# Patient Record
Sex: Female | Born: 1935 | Race: White | Hispanic: Yes | Marital: Married | Smoking: Never smoker
Health system: Southern US, Community
[De-identification: ages and names within clinical notes are randomized; demographics above are authoritative.]

## PROBLEM LIST (undated history)

## (undated) DIAGNOSIS — M810 Age-related osteoporosis without current pathological fracture: Secondary | ICD-10-CM

## (undated) DIAGNOSIS — K449 Diaphragmatic hernia without obstruction or gangrene: Secondary | ICD-10-CM

## (undated) DIAGNOSIS — I10 Essential (primary) hypertension: Secondary | ICD-10-CM

## (undated) DIAGNOSIS — K635 Polyp of colon: Secondary | ICD-10-CM

## (undated) HISTORY — DX: Age-related osteoporosis without current pathological fracture: M81.0

## (undated) HISTORY — DX: Polyp of colon: K63.5

## (undated) HISTORY — DX: Diaphragmatic hernia without obstruction or gangrene: K44.9

## (undated) HISTORY — PX: ABDOMINAL HYSTERECTOMY: SHX81

## (undated) HISTORY — DX: Essential (primary) hypertension: I10

---

## 2006-06-23 ENCOUNTER — Ambulatory Visit: Payer: Self-pay | Admitting: Pain Medicine

## 2006-07-27 ENCOUNTER — Ambulatory Visit: Payer: Self-pay | Admitting: Anesthesiology

## 2006-07-28 ENCOUNTER — Ambulatory Visit: Payer: Self-pay | Admitting: Pain Medicine

## 2008-04-05 ENCOUNTER — Ambulatory Visit: Payer: Self-pay | Admitting: Pain Medicine

## 2011-11-03 ENCOUNTER — Ambulatory Visit: Payer: Self-pay | Admitting: Pain Medicine

## 2011-11-03 LAB — HEPATIC FUNCTION PANEL A (ARMC)
Bilirubin, Direct: 0.2 mg/dL (ref 0.00–0.20)
SGOT(AST): 34 U/L (ref 15–37)
SGPT (ALT): 57 U/L
Total Protein: 7.6 g/dL (ref 6.4–8.2)

## 2011-11-29 ENCOUNTER — Ambulatory Visit: Payer: Self-pay | Admitting: Pain Medicine

## 2012-06-29 ENCOUNTER — Ambulatory Visit: Payer: Self-pay | Admitting: Pain Medicine

## 2012-10-25 HISTORY — PX: ABDOMINAL HYSTERECTOMY: SHX81

## 2012-12-24 DIAGNOSIS — N952 Postmenopausal atrophic vaginitis: Secondary | ICD-10-CM | POA: Insufficient documentation

## 2012-12-24 DIAGNOSIS — R339 Retention of urine, unspecified: Secondary | ICD-10-CM | POA: Insufficient documentation

## 2012-12-24 DIAGNOSIS — N814 Uterovaginal prolapse, unspecified: Secondary | ICD-10-CM | POA: Insufficient documentation

## 2013-01-15 DIAGNOSIS — R3129 Other microscopic hematuria: Secondary | ICD-10-CM | POA: Insufficient documentation

## 2013-01-15 DIAGNOSIS — I10 Essential (primary) hypertension: Secondary | ICD-10-CM | POA: Insufficient documentation

## 2014-04-01 ENCOUNTER — Ambulatory Visit: Payer: Self-pay | Admitting: Pain Medicine

## 2014-04-08 ENCOUNTER — Other Ambulatory Visit: Payer: Self-pay | Admitting: Family Medicine

## 2014-04-08 DIAGNOSIS — M810 Age-related osteoporosis without current pathological fracture: Secondary | ICD-10-CM

## 2014-04-18 ENCOUNTER — Ambulatory Visit
Admission: RE | Admit: 2014-04-18 | Discharge: 2014-04-18 | Disposition: A | Discharge status: Home / Self Care | Payer: Medicare HMO | Source: Ambulatory Visit | Attending: Family Medicine | Admitting: Family Medicine

## 2014-04-18 DIAGNOSIS — M810 Age-related osteoporosis without current pathological fracture: Secondary | ICD-10-CM

## 2014-04-29 ENCOUNTER — Other Ambulatory Visit: Payer: Self-pay

## 2016-10-05 ENCOUNTER — Telehealth: Payer: Self-pay | Admitting: Pain Medicine

## 2016-10-05 ENCOUNTER — Encounter: Payer: Self-pay | Admitting: Pain Medicine

## 2016-10-05 ENCOUNTER — Other Ambulatory Visit: Payer: Self-pay | Admitting: Pain Medicine

## 2016-10-05 DIAGNOSIS — M159 Polyosteoarthritis, unspecified: Secondary | ICD-10-CM | POA: Insufficient documentation

## 2016-10-05 DIAGNOSIS — M7918 Myalgia, other site: Secondary | ICD-10-CM

## 2016-10-05 DIAGNOSIS — M810 Age-related osteoporosis without current pathological fracture: Secondary | ICD-10-CM

## 2016-10-05 DIAGNOSIS — G8929 Other chronic pain: Secondary | ICD-10-CM | POA: Insufficient documentation

## 2016-10-05 DIAGNOSIS — M5136 Other intervertebral disc degeneration, lumbar region: Secondary | ICD-10-CM

## 2016-10-05 DIAGNOSIS — Z87898 Personal history of other specified conditions: Secondary | ICD-10-CM

## 2016-10-05 DIAGNOSIS — M79605 Pain in left leg: Secondary | ICD-10-CM

## 2016-10-05 DIAGNOSIS — N952 Postmenopausal atrophic vaginitis: Secondary | ICD-10-CM

## 2016-10-05 DIAGNOSIS — M51369 Other intervertebral disc degeneration, lumbar region without mention of lumbar back pain or lower extremity pain: Secondary | ICD-10-CM

## 2016-10-05 DIAGNOSIS — M542 Cervicalgia: Secondary | ICD-10-CM

## 2016-10-05 DIAGNOSIS — R3129 Other microscopic hematuria: Secondary | ICD-10-CM

## 2016-10-05 DIAGNOSIS — R339 Retention of urine, unspecified: Secondary | ICD-10-CM

## 2016-10-05 DIAGNOSIS — M25561 Pain in right knee: Secondary | ICD-10-CM

## 2016-10-05 DIAGNOSIS — M4310 Spondylolisthesis, site unspecified: Secondary | ICD-10-CM | POA: Insufficient documentation

## 2016-10-05 DIAGNOSIS — M5442 Lumbago with sciatica, left side: Secondary | ICD-10-CM

## 2016-10-05 DIAGNOSIS — M47816 Spondylosis without myelopathy or radiculopathy, lumbar region: Secondary | ICD-10-CM | POA: Insufficient documentation

## 2016-10-05 DIAGNOSIS — M150 Primary generalized (osteo)arthritis: Secondary | ICD-10-CM

## 2016-10-05 DIAGNOSIS — E559 Vitamin D deficiency, unspecified: Secondary | ICD-10-CM

## 2016-10-05 DIAGNOSIS — M25562 Pain in left knee: Secondary | ICD-10-CM

## 2016-10-05 DIAGNOSIS — I10 Essential (primary) hypertension: Secondary | ICD-10-CM

## 2016-10-05 DIAGNOSIS — N814 Uterovaginal prolapse, unspecified: Secondary | ICD-10-CM

## 2016-10-05 DIAGNOSIS — E7800 Pure hypercholesterolemia, unspecified: Secondary | ICD-10-CM

## 2016-10-05 DIAGNOSIS — R12 Heartburn: Secondary | ICD-10-CM | POA: Insufficient documentation

## 2016-10-05 MED ORDER — CHOLECALCIFEROL 25 MCG (1000 UT) PO TABS: 1000.0000 [IU] | ORAL_TABLET | Freq: Every day | ORAL | 0 refills | Status: DC

## 2016-10-05 MED ORDER — ALENDRONATE SODIUM 70 MG PO TABS: 70.0000 mg | ORAL_TABLET | ORAL | 0 refills | Status: DC

## 2016-10-05 MED ORDER — PREDNISONE 20 MG PO TABS: ORAL_TABLET | ORAL | 0 refills | Status: DC

## 2016-10-05 MED ORDER — VITAMIN C 500 MG PO TABS: 500.0000 mg | ORAL_TABLET | Freq: Every day | ORAL | 0 refills | Status: AC

## 2016-10-05 MED ORDER — FAMOTIDINE-CA CARB-MAG HYDROX 10-800-165 MG PO CHEW: 1.0000 | CHEWABLE_TABLET | Freq: Every day | ORAL | 0 refills | Status: DC | PRN

## 2016-10-05 MED ORDER — GLUCOSAMINE-CHONDROITIN 500-400 MG PO TABS: 1.0000 | ORAL_TABLET | Freq: Every day | ORAL | 0 refills | Status: DC

## 2016-10-05 MED ORDER — MELOXICAM 15 MG PO TABS: 15.0000 mg | ORAL_TABLET | Freq: Every day | ORAL | 0 refills | Status: DC

## 2016-10-05 MED ORDER — CYANOCOBALAMIN 1500 MCG PO TBDP: 1.0000 | ORAL_TABLET | Freq: Every day | ORAL | 0 refills | Status: DC

## 2016-10-05 MED ORDER — SIMVASTATIN 20 MG PO TABS: 20.0000 mg | ORAL_TABLET | Freq: Every day | ORAL | 0 refills | Status: DC

## 2016-10-05 MED ORDER — TIZANIDINE HCL 2 MG PO CAPS: 2.0000 mg | ORAL_CAPSULE | Freq: Every day | ORAL | 0 refills | Status: DC

## 2016-10-05 MED ORDER — ASPIRIN 81 MG PO CHEW: 81.0000 mg | CHEWABLE_TABLET | Freq: Every day | ORAL | 0 refills | Status: DC

## 2016-10-05 MED ORDER — MULTIVITAMINS PO CAPS: 1.0000 | ORAL_CAPSULE | Freq: Every day | ORAL | 0 refills | Status: AC

## 2016-10-05 MED ORDER — LOSARTAN POTASSIUM-HCTZ 50-12.5 MG PO TABS: 1.0000 | ORAL_TABLET | Freq: Every day | ORAL | 0 refills | Status: DC
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
## 2016-10-05 NOTE — Telephone Encounter (Signed)
Patient called to indicate she is having a flareup of her low back pain and she wants to come in for possible repeat lumbar epidural steroid injection. I have agreed to bring her in for an evaluation.

## 2016-10-15 ENCOUNTER — Telehealth: Payer: Self-pay | Admitting: Pain Medicine

## 2016-10-15 NOTE — Telephone Encounter (Signed)
Called Pharm. Needed to clarify quantity of tablets of Prednisone. Instructed to fill as order since MD not available until 10/26/16

## 2016-10-15 NOTE — Telephone Encounter (Signed)
[facility] called asking for clarification on script written from Dr. Laban EmperorNaveira  [phone]

## 2016-11-07 ENCOUNTER — Other Ambulatory Visit: Payer: Self-pay | Admitting: Pain Medicine

## 2016-11-07 DIAGNOSIS — M810 Age-related osteoporosis without current pathological fracture: Secondary | ICD-10-CM

## 2016-11-07 MED ORDER — ALENDRONATE SODIUM 70 MG PO TABS: 70.0000 mg | ORAL_TABLET | ORAL | 0 refills | Status: DC

## 2016-11-18 ENCOUNTER — Ambulatory Visit
Admission: RE | Admit: 2016-11-18 | Discharge: 2016-11-18 | Disposition: A | Discharge status: Home / Self Care | Payer: Medicare PPO | Source: Ambulatory Visit | Attending: Pain Medicine | Admitting: Pain Medicine

## 2016-11-18 ENCOUNTER — Other Ambulatory Visit
Admission: RE | Admit: 2016-11-18 | Discharge: 2016-11-18 | Disposition: A | Discharge status: Home / Self Care | Payer: Medicare PPO | Source: Ambulatory Visit | Attending: Pain Medicine | Admitting: Pain Medicine

## 2016-11-18 DIAGNOSIS — M150 Primary generalized (osteo)arthritis: Secondary | ICD-10-CM | POA: Insufficient documentation

## 2016-11-18 DIAGNOSIS — M25562 Pain in left knee: Secondary | ICD-10-CM

## 2016-11-18 DIAGNOSIS — M5136 Other intervertebral disc degeneration, lumbar region: Secondary | ICD-10-CM | POA: Insufficient documentation

## 2016-11-18 DIAGNOSIS — M79605 Pain in left leg: Secondary | ICD-10-CM | POA: Insufficient documentation

## 2016-11-18 DIAGNOSIS — M47816 Spondylosis without myelopathy or radiculopathy, lumbar region: Secondary | ICD-10-CM | POA: Insufficient documentation

## 2016-11-18 DIAGNOSIS — M7918 Myalgia, other site: Principal | ICD-10-CM

## 2016-11-18 DIAGNOSIS — G8929 Other chronic pain: Secondary | ICD-10-CM

## 2016-11-18 DIAGNOSIS — M542 Cervicalgia: Principal | ICD-10-CM

## 2016-11-18 DIAGNOSIS — M25561 Pain in right knee: Secondary | ICD-10-CM | POA: Insufficient documentation

## 2016-11-18 DIAGNOSIS — M159 Polyosteoarthritis, unspecified: Secondary | ICD-10-CM

## 2016-11-18 DIAGNOSIS — M810 Age-related osteoporosis without current pathological fracture: Secondary | ICD-10-CM

## 2016-11-18 DIAGNOSIS — I10 Essential (primary) hypertension: Secondary | ICD-10-CM

## 2016-11-18 DIAGNOSIS — M5442 Lumbago with sciatica, left side: Secondary | ICD-10-CM

## 2016-11-18 DIAGNOSIS — R12 Heartburn: Secondary | ICD-10-CM

## 2016-11-18 DIAGNOSIS — E559 Vitamin D deficiency, unspecified: Secondary | ICD-10-CM

## 2016-11-18 LAB — C-REACTIVE PROTEIN

## 2016-11-18 LAB — COMPREHENSIVE METABOLIC PANEL
ALT: 19 U/L (ref 14–54)
AST: 25 U/L (ref 15–41)
Albumin: 4.4 g/dL (ref 3.5–5.0)
Alkaline Phosphatase: 50 U/L (ref 38–126)
Anion gap: 9 (ref 5–15)
BUN: 22 mg/dL — AB (ref 6–20)
CHLORIDE: 101 mmol/L (ref 101–111)
CO2: 27 mmol/L (ref 22–32)
CREATININE: 0.66 mg/dL (ref 0.44–1.00)
Calcium: 9.5 mg/dL (ref 8.9–10.3)
Glucose, Bld: 95 mg/dL (ref 65–99)
Potassium: 3.4 mmol/L — ABNORMAL LOW (ref 3.5–5.1)
Sodium: 137 mmol/L (ref 135–145)
TOTAL PROTEIN: 7.5 g/dL (ref 6.5–8.1)
Total Bilirubin: 1.3 mg/dL — ABNORMAL HIGH (ref 0.3–1.2)

## 2016-11-18 LAB — VITAMIN B12: Vitamin B-12: 1010 pg/mL — ABNORMAL HIGH (ref 180–914)

## 2016-11-18 LAB — SEDIMENTATION RATE: Sed Rate: 39 mm/hr — ABNORMAL HIGH (ref 0–30)

## 2016-11-18 LAB — MAGNESIUM: MAGNESIUM: 2 mg/dL (ref 1.7–2.4)

## 2016-11-21 LAB — 25-HYDROXYVITAMIN D LCMS D2+D3: 25-HYDROXY, VITAMIN D: 23 ng/mL — AB

## 2016-11-21 LAB — 25-HYDROXY VITAMIN D LCMS D2+D3
25-Hydroxy, Vitamin D-2: 1.7 ng/mL
25-Hydroxy, Vitamin D-3: 21 ng/mL

## 2016-11-23 ENCOUNTER — Ambulatory Visit: Payer: Medicare PPO | Attending: Pain Medicine | Admitting: Pain Medicine

## 2016-11-23 ENCOUNTER — Encounter: Payer: Self-pay | Admitting: Pain Medicine

## 2016-11-23 VITALS — BP 149/64 | HR 66 | Temp 98.4°F | Resp 16 | Ht 62.0 in | Wt 145.0 lb

## 2016-11-23 DIAGNOSIS — M7918 Myalgia, other site: Secondary | ICD-10-CM

## 2016-11-23 DIAGNOSIS — M545 Low back pain: Secondary | ICD-10-CM | POA: Insufficient documentation

## 2016-11-23 DIAGNOSIS — M5410 Radiculopathy, site unspecified: Secondary | ICD-10-CM

## 2016-11-23 DIAGNOSIS — M150 Primary generalized (osteo)arthritis: Secondary | ICD-10-CM

## 2016-11-23 DIAGNOSIS — G894 Chronic pain syndrome: Secondary | ICD-10-CM | POA: Diagnosis not present

## 2016-11-23 DIAGNOSIS — G8929 Other chronic pain: Secondary | ICD-10-CM | POA: Insufficient documentation

## 2016-11-23 DIAGNOSIS — M5442 Lumbago with sciatica, left side: Secondary | ICD-10-CM

## 2016-11-23 DIAGNOSIS — Z79899 Other long term (current) drug therapy: Secondary | ICD-10-CM | POA: Insufficient documentation

## 2016-11-23 DIAGNOSIS — M47816 Spondylosis without myelopathy or radiculopathy, lumbar region: Secondary | ICD-10-CM

## 2016-11-23 DIAGNOSIS — M5136 Other intervertebral disc degeneration, lumbar region: Secondary | ICD-10-CM

## 2016-11-23 DIAGNOSIS — M79605 Pain in left leg: Secondary | ICD-10-CM | POA: Insufficient documentation

## 2016-11-23 DIAGNOSIS — M810 Age-related osteoporosis without current pathological fracture: Secondary | ICD-10-CM | POA: Diagnosis not present

## 2016-11-23 DIAGNOSIS — Z7982 Long term (current) use of aspirin: Secondary | ICD-10-CM | POA: Insufficient documentation

## 2016-11-23 DIAGNOSIS — M4310 Spondylolisthesis, site unspecified: Secondary | ICD-10-CM | POA: Diagnosis not present

## 2016-11-23 DIAGNOSIS — M542 Cervicalgia: Secondary | ICD-10-CM

## 2016-11-23 DIAGNOSIS — E559 Vitamin D deficiency, unspecified: Secondary | ICD-10-CM | POA: Insufficient documentation

## 2016-11-23 DIAGNOSIS — Z0001 Encounter for general adult medical examination with abnormal findings: Secondary | ICD-10-CM | POA: Diagnosis present

## 2016-11-23 DIAGNOSIS — M25561 Pain in right knee: Secondary | ICD-10-CM

## 2016-11-23 DIAGNOSIS — M1288 Other specific arthropathies, not elsewhere classified, other specified site: Secondary | ICD-10-CM

## 2016-11-23 DIAGNOSIS — M159 Polyosteoarthritis, unspecified: Secondary | ICD-10-CM

## 2016-11-23 DIAGNOSIS — M792 Neuralgia and neuritis, unspecified: Secondary | ICD-10-CM

## 2016-11-23 DIAGNOSIS — M791 Myalgia: Secondary | ICD-10-CM

## 2016-11-23 DIAGNOSIS — M25562 Pain in left knee: Secondary | ICD-10-CM

## 2016-11-23 MED ORDER — GABAPENTIN 100 MG PO CAPS: 100.0000 mg | ORAL_CAPSULE | Freq: Every day | ORAL | 1 refills | Status: DC

## 2016-11-23 MED ORDER — PREDNISONE 20 MG PO TABS: ORAL_TABLET | ORAL | 0 refills | Status: DC

## 2016-11-23 MED ORDER — MAGNESIUM OXIDE -MG SUPPLEMENT 500 MG PO CAPS: 1.0000 | ORAL_CAPSULE | Freq: Two times a day (BID) | ORAL | 1 refills | Status: DC

## 2016-11-23 MED ORDER — VITAMIN D (ERGOCALCIFEROL) 1.25 MG (50000 UNIT) PO CAPS: ORAL_CAPSULE | ORAL | 0 refills | Status: DC

## 2016-11-23 MED ORDER — TIZANIDINE HCL 2 MG PO CAPS: 2.0000 mg | ORAL_CAPSULE | Freq: Every day | ORAL | 1 refills | Status: DC

## 2016-11-23 MED ORDER — MELOXICAM 15 MG PO TABS: 15.0000 mg | ORAL_TABLET | Freq: Every day | ORAL | 1 refills | Status: DC

## 2016-11-23 MED ORDER — VITAMIN D3 50 MCG (2000 UT) PO CAPS: ORAL_CAPSULE | ORAL | 99 refills | Status: DC

## 2016-11-23 NOTE — Progress Notes (Signed)
Safety precautions to be maintained throughout the outpatient stay will include: orient to surroundings, keep bed in low position, maintain call bell within reach at all times, provide assistance with transfer out of bed and ambulation.  

## 2016-11-23 NOTE — Progress Notes (Signed)
Patient's Name: Virginia Moore  MRN: [id number]  Referring Provider: No ref. provider found  DOB: [birth year]  PCP: No primary care provider on file.  DOS: 11/23/2016  Note by: Virginia Levans. Laban Emperor, MD  Service setting: Ambulatory outpatient  Specialty: Interventional Pain Management  Location: [facility] (AMB) Pain Management Facility    Patient type: New Patient   Primary Reason(s) for Visit: Initial Patient Evaluation CC: Back Pain (lower); Neck Pain (base of the neck); and Knee Pain (both  knees, left worse than the right)  HPI  Ms. [address] Irving Moore is a 81 y.o. year old, female patient, who comes today for an initial evaluation. She has Chronic low back pain (Location of Primary Source of Pain) (Left); Lumbar facet syndrome (Bilateral) (L>R); Chronic lower extremity pain (Location of Secondary source of pain) (Left); Grade 1 Anterolisthesis (2-3 mm) (L4 over L5); Lumbar DDD (degenerative disc disease) (L4-5); Spondylosis of lumbar spine; Essential hypertension; Incomplete emptying of bladder; History of Microscopic hematuria; History of surgically corrected Uterovaginal prolapse (2014); Vaginal atrophy; Osteoporosis, senile; History of vertigo; Pure hypercholesterolemia; Osteoarthritis involving multiple joints; Vitamin D insufficiency; Chronic musculoskeletal pain; Heartburn symptom; Chronic neck pain (Bilateral) (L>R); Chronic knee pain (Bilateral) (L>R); Chronic pain syndrome; Neurogenic pain; and Chronic radicular pain of lower extremity (L) (L4) on her problem list.. Her primarily concern today is the Back Pain (lower); Neck Pain (base of the neck); and Knee Pain (both  knees, left worse than the right)  Pain Assessment: Self-Reported Pain Score: 1 /10             Reported level is compatible with observation.       Pain Type: Chronic pain Pain Location: Back Pain Orientation: Lower, Left, Right (sciatica pain on the left side) Pain Descriptors / Indicators: Aching, Radiating, Numbness Pain  Frequency: Intermittent  Onset and Duration: Gradual Cause of pain: Unknown Severity: Getting worse Timing: Morning and After a period of immobility Aggravating Factors: Prolonged standing Alleviating Factors: Medications Associated Problems: Pain that wakes patient up Quality of Pain: Pressure-like Previous Examinations or Tests: X-rays Previous Treatments: Epidural steroid injections and Physical Therapy  The patient comes into the clinics today for the first time for a chronic pain management evaluation. The patient indicates the primary pain to be that of the lower back followed by occasional neck pain. The low back pain has been worsening over time and she recently had a viral syndrome that triggered a considerable amount of coughing which led to left leg numbness over the L4 dermatomal distribution. This was no and has improved over the past couple weeks. The patient's last  Lumbar MRI was done around 2009. At that time, there was no documentation of anterolisthesis, as we now see on the plain film x-rays. In view of this, we will repeat that MRI to updated. She also has a known history of central spinal stenosis for which she has received occasional lumbar epidural steroid injections with great success. Today we also went over the results of her blood work which demonstrates an elevated sedimentation rate, hypokalemia, and a vitamin D insufficiency.  Historic Controlled Substance Pharmacotherapy Review  Analgesic: None MME/day: 0 mg/day  Pharmacodynamics: N/A Historical Monitoring: N/A Historical Background Evaluation: [place] PDMP: Six (6) year initial data search conducted.             [place] Department of public safety, offender search: Engineer, mining Information) Non-contributory Risk Assessment Profile: Aberrant behavior: None observed or detected today Risk factors for fatal opioid overdose: None identified today  Fatal overdose hazard ratio (HR): Calculation deferred Non-fatal overdose  hazard ratio (HR): Calculation deferred Risk of opioid abuse or dependence: 0.7-3.0% with doses ? 36 MME/day and 6.1-26% with doses ? 120 MME/day. Opioid risk tool (ORT) (Total Score): 0  ORT Scoring interpretation table:  Score <3 = Low Risk for SUD  Score between 4-7 = Moderate Risk for SUD  Score >8 = High Risk for Opioid Abuse   PHQ-2 Depression Scale:  Total score: 0  PHQ-2 Scoring interpretation table: (Score and probability of major depressive disorder)  Score 0 = No depression  Score 1 = 15.4% Probability  Score 2 = 21.1% Probability  Score 3 = 38.4% Probability  Score 4 = 45.5% Probability  Score 5 = 56.4% Probability  Score 6 = 78.6% Probability   PHQ-9 Depression Scale:  Total score: 0  PHQ-9 Scoring interpretation table:  Score 0-4 = No depression  Score 5-9 = Mild depression  Score 10-14 = Moderate depression  Score 15-19 = Moderately severe depression  Score 20-27 = Severe depression (2.4 times higher risk of SUD and 2.89 times higher risk of overuse)   Pharmacologic Plan: We'll concentrate on non-opioid therapy.  Meds  The patient has a current medication list which includes the following prescription(s): alendronate, aspirin, cholecalciferol, vitamin d3, famotidine-calcium carbonate-magnesium hydroxide, gabapentin, glucosamine-chondroitin, losartan-hydrochlorothiazide, magnesium oxide, meloxicam, multivitamin, prednisone, simvastatin, tizanidine, vitamin c, and vitamin d (ergocalciferol).  Current Outpatient Prescriptions on File Prior to Visit  Medication Sig  . alendronate (FOSAMAX) 70 MG tablet Take 1 tablet (70 mg total) by mouth once a week.  Marland Kitchen. aspirin 81 MG chewable tablet Chew 1 tablet (81 mg total) by mouth daily.  . Cholecalciferol (VITAMIN D-1000 MAX ST) 1000 units tablet Take 1 tablet (1,000 Units total) by mouth daily.  . famotidine-calcium carbonate-magnesium hydroxide (PEPCID COMPLETE) 10-800-165 MG chewable tablet Chew 1 tablet by mouth daily as  needed.  Marland Kitchen. glucosamine-chondroitin 500-400 MG tablet Take 1 tablet by mouth daily.  Marland Kitchen. losartan-hydrochlorothiazide (HYZAAR) 50-12.5 MG tablet Take 1 tablet by mouth daily.  . Multiple Vitamin (MULTIVITAMIN) capsule Take 1 capsule by mouth daily.  . simvastatin (ZOCOR) 20 MG tablet Take 1 tablet (20 mg total) by mouth daily.  . vitamin C (ASCORBIC ACID) 500 MG tablet Take 1 tablet (500 mg total) by mouth daily.   No current facility-administered medications on file prior to visit.    Imaging Review  Cervical Imaging: Cervical MR wo contrast:  Results for orders placed in visit on 04/05/08  MR C Spine Ltd W/O Cm   Narrative * PRIOR REPORT IMPORTED FROM AN EXTERNAL SYSTEM *   PRIOR REPORT IMPORTED FROM THE [facility] WORKFLOW SYSTEM   REASON FOR EXAM:    cervical spinal stenosis, lumbar spinal stenosis, low  back pain  COMMENTS:   PROCEDURE:     MR  - MR CERVICAL SPINE WO CONT  - Apr 05 2008 11:23AM   RESULT:   HISTORY:   Cervical spine stenosis.   COMPARISON STUDIES:  None.   PROCEDURE AND FINDINGS:     Multiplanar and multisequence imaging of the  cervical spine was obtained.  There is no evidence of disc protrusion or  spinal stenosis.  The cervical cord is normal.  The neural foramen are  patent. No evidence of acute bony abnormality is identified.   IMPRESSION:      Multilevel disc degeneration with degenerative endplate  osteophyte formation and annular bulge. No acute abnormalities identified.  The neural foramen are patent.  Thank you for the opportunity to contribute to the care of your patient.       Cervical DG complete:  Results for orders placed during the hospital encounter of 11/18/16  DG Cervical Spine Complete   Narrative CLINICAL DATA:  Chronic neck and low back pain, back pain radiates to left SI, bilateral knee pain  EXAM: CERVICAL SPINE - COMPLETE 4+ VIEW  COMPARISON:  None.  FINDINGS: No fracture. No spondylolisthesis. There is a reversal the  normal cervical lordosis centered at C5. There is mild loss of disc height at C4-C5 with moderate loss disc height at C5-C6 and C6-C7. Mild endplate spurring is noted at these levels. Uncovertebral spurring on the left at C5-C6 causes moderate neural foraminal narrowing. No other significant stenosis.  The bones are demineralized.  The soft tissues are unremarkable.  IMPRESSION: 1. No fracture or acute finding. 2. Degenerative changes as detailed above.   Electronically Signed   By: Amie Portland M.D.   On: 11/18/2016 12:47    Lumbosacral Imaging: Lumbar MR wo contrast:  Results for orders placed in visit on 04/05/08  MR L Spine Ltd W/O Cm   Narrative * PRIOR REPORT IMPORTED FROM AN EXTERNAL SYSTEM *   PRIOR REPORT IMPORTED FROM THE [facility] WORKFLOW SYSTEM   REASON FOR EXAM:   Cervical spinal stenosis, lumbar spinal stenosis, low  back pain  COMMENTS:   PROCEDURE:     MR  - MR LUMBAR SPINE WO CONTRAST  - Apr 05 2008 11:23AM   RESULT:     The patient has a history of low back pain with radiation into  RIGHT lower extremity.   TECHNIQUE: Multiplanar/multisequence imaging of the lumbar spine is  obtained.   Comparison is made to prior MRI report of 11/08/2003. Those images are not  currently available for comparison.   FINDINGS:  Minimal disc degeneration is present. There is no evidence of  disc protrusion or spinal stenosis. The neural foramina are patent. The  lumbar cord is normal. No bony lesions are noted.   IMPRESSION:     Mild diffuse degenerative change. No acute or focal  abnormality identified. There is no evidence of significant disc  protrusion  or spinal stenosis. No acute bony abnormality identified.   Thank you for this opportunity to contribute to the care of your patient.       Lumbar DG Bending views:  Results for orders placed during the hospital encounter of 11/18/16  DG Lumbar Spine Complete W/Bend   Narrative CLINICAL DATA:  Chronic neck  and low back pain, back pain radiates to left SI, bilateral knee pain  EXAM: LUMBAR SPINE - COMPLETE WITH BENDING VIEWS  COMPARISON:  None.  FINDINGS: No fracture.  There is a grade 1 anterolisthesis of L4 on L5 with a slight, grade 1, anterolisthesis of L5 on S1. No other spondylolisthesis.  There is moderate loss of disc height at L4-L5 and L5-S1. Remaining lumbar discs are well preserved in height.  There are facet degenerative changes bilaterally at L4-L5-L5-S1.  The bones are demineralized.  There is no bone lesion.  On flexion and extension views, there is no significant change in alignment. The degree of anterolisthesis at L4-L5 and L5-S1 is stable. The cyst  There scattered aortic vascular calcifications. Soft tissues are otherwise unremarkable.  IMPRESSION: 1. No fracture or acute finding.  No bone lesion. 2. Degenerative changes of the lower lumbar spine, both facet and disc, with anterolistheses noted of L4-L5 and minimally of L5  on S1.   Electronically Signed   By: Amie Portland M.D.   On: 11/18/2016 12:49    Sacroiliac Joint Imaging: Sacroiliac Joint DG:  Results for orders placed during the hospital encounter of 11/18/16  DG Si Joints   Narrative CLINICAL DATA:  Chronic neck and low back pain, back pain radiates to left SI, bilateral knee pain  EXAM: BILATERAL SACROILIAC JOINTS - 3+ VIEW  COMPARISON:  None.  FINDINGS: The sacroiliac joint spaces are maintained and there is no evidence of arthropathy. No other bone abnormalities are seen.  IMPRESSION: Negative.   Electronically Signed   By: Amie Portland M.D.   On: 11/18/2016 12:51    Knee Imaging: Knee-R DG 1-2 views:  Results for orders placed during the hospital encounter of 11/18/16  DG Knee 1-2 Views Right   Narrative CLINICAL DATA:  Chronic neck and low back pain, back pain radiates to left SI, bilateral knee pain  EXAM: RIGHT KNEE - 1-2 VIEW  COMPARISON:   None.  FINDINGS: No fracture.  No bone lesion.  The knee joint is normally spaced and aligned with no significant arthropathic change.  No joint effusion.  Soft tissues are unremarkable.  IMPRESSION: Negative.   Electronically Signed   By: Amie Portland M.D.   On: 11/18/2016 12:50    Knee-L DG 1-2 views:  Results for orders placed during the hospital encounter of 11/18/16  DG Knee 1-2 Views Left   Narrative CLINICAL DATA:  Chronic neck and low back pain, back pain radiates to left SI, bilateral knee pain  EXAM: LEFT KNEE - 1-2 VIEW  COMPARISON:  None.  FINDINGS: No fracture.  No bone lesion.  Mild medial joint space compartment narrowing. Minimal mediolateral compartment marginal osteophytes. No other degenerative change.  No joint effusion.  The soft tissues are unremarkable.  IMPRESSION: 1. No fracture or acute finding. 2. Minimal degenerative change.   Electronically Signed   By: Amie Portland M.D.   On: 11/18/2016 12:51    Note: Results of ordered imaging test(s) reviewed and explained to patient in Layman's terms.        ROS  Cardiovascular History: Daily Aspirin intake and Hypertension Pulmonary or Respiratory History: Snoring  Neurological History: Negative for epilepsy, stroke, urinary or fecal inontinence, spina bifida or tethered cord syndrome Review of Past Neurological Studies: No results found for this or any previous visit. Psychological-Psychiatric History: Negative for anxiety, depression, schizophrenia, bipolar disorders or suicidal ideations or attempts Gastrointestinal History: Negative for peptic ulcer disease, hiatal hernia, GERD, IBS, hepatitis, cirrhosis or pancreatitis Genitourinary History: Negative for nephrolithiasis, hematuria, renal failure or chronic kidney disease Hematological History: Negative for anticoagulant therapy, anemia, bruising or bleeding easily, hemophilia, sickle cell disease or trait, thrombocytopenia or  coagulupathies Endocrine History: Negative for diabetes or thyroid disease Rheumatologic History: Osteoarthritis Musculoskeletal History: Negative for myasthenia gravis, muscular dystrophy, multiple sclerosis or malignant hyperthermia Work History: Retired  Allergies  Ms. [facility] has No Known Allergies.  Laboratory Chemistry  Inflammation Markers Lab Results  Component Value Date   ESRSEDRATE 39 (H) 11/18/2016   CRP <0.8 11/18/2016   Renal Function Lab Results  Component Value Date   BUN 22 (H) 11/18/2016   CREATININE 0.66 11/18/2016   GFRAA >60 11/18/2016   GFRNONAA >60 11/18/2016   Hepatic Function Lab Results  Component Value Date   AST 25 11/18/2016   ALT 19 11/18/2016   ALBUMIN 4.4 11/18/2016   Electrolytes Lab Results  Component Value Date  NA 137 11/18/2016   K 3.4 (L) 11/18/2016   CL 101 11/18/2016   CALCIUM 9.5 11/18/2016   MG 2.0 11/18/2016   Pain Modulating Vitamins Lab Results  Component Value Date   25OHVITD1 23 (L) 11/18/2016   25OHVITD2 1.7 11/18/2016   25OHVITD3 21 11/18/2016   VITAMINB12 1,010 (H) 11/18/2016   Coagulation Parameters No results found for: INR, LABPROT, APTT, PLT Cardiovascular No results found for: BNP, HGB, HCT Note: Lab results reviewed and explained to patient in Layman's terms.  PFSH  Drug: Ms. Shareese Macha  has no drug history on file. Alcohol:  has no alcohol history on file. Tobacco:  reports that she has never smoked. She has never used smokeless tobacco. Medical:  has a past medical history of Hiatal hernia and Hypertension. Family: family history includes Cancer in her father and mother.  Past Surgical History:  Procedure Laterality Date  . ABDOMINAL HYSTERECTOMY     Active Ambulatory Problems    Diagnosis Date Noted  . Chronic low back pain (Location of Primary Source of Pain) (Left) 10/05/2016  . Lumbar facet syndrome (Bilateral) (L>R) 10/05/2016  . Chronic lower extremity pain (Location of Secondary  source of pain) (Left) 10/05/2016  . Grade 1 Anterolisthesis (2-3 mm) (L4 over L5) 10/05/2016  . Lumbar DDD (degenerative disc disease) (L4-5) 10/05/2016  . Spondylosis of lumbar spine 10/05/2016  . Essential hypertension 01/15/2013  . Incomplete emptying of bladder 12/24/2012  . History of Microscopic hematuria 01/15/2013  . History of surgically corrected Uterovaginal prolapse (2014) 12/24/2012  . Vaginal atrophy 12/24/2012  . Osteoporosis, senile 10/05/2016  . History of vertigo 10/05/2016  . Pure hypercholesterolemia 10/05/2016  . Osteoarthritis involving multiple joints 10/05/2016  . Vitamin D insufficiency 10/05/2016  . Chronic musculoskeletal pain 10/05/2016  . Heartburn symptom 10/05/2016  . Chronic neck pain (Bilateral) (L>R) 10/05/2016  . Chronic knee pain (Bilateral) (L>R) 10/05/2016  . Chronic pain syndrome 11/23/2016  . Neurogenic pain 11/23/2016  . Chronic radicular pain of lower extremity (L) (L4) 11/23/2016   Resolved Ambulatory Problems    Diagnosis Date Noted  . No Resolved Ambulatory Problems   Past Medical History:  Diagnosis Date  . Hiatal hernia   . Hypertension    Constitutional Exam  General appearance: Well nourished, well developed, and well hydrated. In no apparent acute distress Vitals:   11/23/16 1153  BP: (!) 149/64  Pulse: 66  Resp: 16  Temp: 98.4 F (36.9 C)  TempSrc: Oral  SpO2: 98%  Weight: 145 lb (65.8 kg)  Height: 5\' 2"  (1.575 m)   BMI Assessment: Estimated body mass index is 26.52 kg/m as calculated from the following:   Height as of this encounter: 5\' 2"  (1.575 m).   Weight as of this encounter: 145 lb (65.8 kg).  BMI interpretation table: BMI level Category Range association with higher incidence of chronic pain  <18 kg/m2 Underweight   18.5-24.9 kg/m2 Ideal body weight   25-29.9 kg/m2 Overweight Increased incidence by 20%  30-34.9 kg/m2 Obese (Class I) Increased incidence by 68%  35-39.9 kg/m2 Severe obesity (Class II)  Increased incidence by 136%  >40 kg/m2 Extreme obesity (Class III) Increased incidence by 254%   BMI Readings from Last 4 Encounters:  11/23/16 26.52 kg/m   Wt Readings from Last 4 Encounters:  11/23/16 145 lb (65.8 kg)  Psych/Mental status: Alert, oriented x 3 (person, place, & time)       Eyes: PERLA Respiratory: No evidence of acute respiratory distress  Cervical Spine  Exam  Inspection: No masses, redness, or swelling Alignment: Symmetrical Functional ROM: Unrestricted ROM Stability: No instability detected Muscle strength & Tone: Functionally intact Sensory: Unimpaired Palpation: Non-contributory  Upper Extremity (UE) Exam    Side: Right upper extremity  Side: Left upper extremity  Inspection: No masses, redness, swelling, or asymmetry  Inspection: No masses, redness, swelling, or asymmetry  Functional ROM: Unrestricted ROM          Functional ROM: Unrestricted ROM          Muscle strength & Tone: Functionally intact  Muscle strength & Tone: Functionally intact  Sensory: Unimpaired  Sensory: Unimpaired  Palpation: Non-contributory  Palpation: Non-contributory   Thoracic Spine Exam  Inspection: No masses, redness, or swelling Alignment: Symmetrical Functional ROM: Unrestricted ROM Stability: No instability detected Sensory: Unimpaired Muscle strength & Tone: Functionally intact Palpation: Non-contributory  Lumbar Spine Exam  Inspection: No masses, redness, or swelling Alignment: Symmetrical Functional ROM: Decreased ROM Stability: No instability detected Muscle strength & Tone: Functionally intact Sensory: Movement-associated pain Palpation: Complains of area being tender to palpation Provocative Tests: Lumbar Hyperextension and rotation test: Positive bilaterally for facet joint pain. Patrick's Maneuver: evaluation deferred today              Gait & Posture Assessment  Ambulation: Unassisted Gait: Relatively normal for age and body habitus Posture: WNL    Lower Extremity Exam    Side: Right lower extremity  Side: Left lower extremity  Inspection: No masses, redness, swelling, or asymmetry  Inspection: No masses, redness, swelling, or asymmetry  Functional ROM: Unrestricted ROM          Functional ROM: Unrestricted ROM          Muscle strength & Tone: Functionally intact  Muscle strength & Tone: Functionally intact  Sensory: Unimpaired  Sensory: Unimpaired  Palpation: Non-contributory  Palpation: Non-contributory   Assessment  Primary Diagnosis & Pertinent Problem List: The primary encounter diagnosis was Chronic low back pain (Location of Primary Source of Pain) (Left). Diagnoses of Chronic pain syndrome, Chronic lower extremity pain (Location of Secondary source of pain) (Left), Osteoarthritis involving multiple joints, Spondylosis of lumbar spine, Lumbar facet syndrome (Bilateral) (L>R), Lumbar DDD (degenerative disc disease) (L4-5), Grade 1 Anterolisthesis (2-3 mm) (L4 over L5), Chronic musculoskeletal pain, Chronic neck pain (Bilateral) (L>R), Chronic knee pain (Bilateral) (L>R), Osteoporosis, senile, Vitamin D insufficiency, Primary osteoarthritis involving multiple joints, Neurogenic pain, and Chronic radicular pain of lower extremity (L) (L4) were also pertinent to this visit.  Visit Diagnosis: 1. Chronic low back pain (Location of Primary Source of Pain) (Left)   2. Chronic pain syndrome   3. Chronic lower extremity pain (Location of Secondary source of pain) (Left)   4. Osteoarthritis involving multiple joints   5. Spondylosis of lumbar spine   6. Lumbar facet syndrome (Bilateral) (L>R)   7. Lumbar DDD (degenerative disc disease) (L4-5)   8. Grade 1 Anterolisthesis (2-3 mm) (L4 over L5)   9. Chronic musculoskeletal pain   10. Chronic neck pain (Bilateral) (L>R)   11. Chronic knee pain (Bilateral) (L>R)   12. Osteoporosis, senile   13. Vitamin D insufficiency   14. Primary osteoarthritis involving multiple joints   15.  Neurogenic pain   16. Chronic radicular pain of lower extremity (L) (L4)    Plan of Care  Initial treatment plan:  Please be advised that as per protocol, today's visit has been an evaluation only. We have not taken over the patient's controlled substance management.  Problem-specific plan: No  problem-specific Assessment & Plan notes found for this encounter.  Ordered Lab-work, Procedure(s), Referral(s), & Consult(s): Orders Placed This Encounter  Procedures  . Lumbar Epidural Injection  . LUMBAR FACET(MEDIAL BRANCH NERVE BLOCK) MBNB  . KNEE INJECTION  . Cervical Epidural Injection  . MR LUMBAR SPINE WO CONTRAST   Pharmacotherapy: Medications ordered:  Meds ordered this encounter  Medications  . predniSONE (DELTASONE) 20 MG tablet    Sig: Take 3 tab(s) in the morning x 3 days, then 2 tab(s) x 3 days, followed by 1 tab x 3 days.    Dispense:  60 tablet    Refill:  0  . tizanidine (ZANAFLEX) 2 MG capsule    Sig: Take 1 capsule (2 mg total) by mouth at bedtime.    Dispense:  90 capsule    Refill:  1    Do not place medication on "Automatic Refill". Fill one day early if pharmacy is closed on scheduled refill date.  . meloxicam (MOBIC) 15 MG tablet    Sig: Take 1 tablet (15 mg total) by mouth daily.    Dispense:  90 tablet    Refill:  1    Do not place medication on "Automatic Refill". Fill one day early if pharmacy is closed on scheduled refill date.  . Vitamin D, Ergocalciferol, (DRISDOL) 50000 units CAPS capsule    Sig: Take 1 capsule (50,000 Units total) by mouth 2 (two) times a week x 6 weeks.    Dispense:  12 capsule    Refill:  0    Do not add to the "Automatic Refill" notification system.  . Cholecalciferol (VITAMIN D3) 2000 units capsule    Sig: Take 1 capsule (2,000 Units total) by mouth daily.    Dispense:  30 capsule    Refill:  PRN    Do not add to the "Automatic Refill" notification system.  . Magnesium Oxide 500 MG CAPS    Sig: Take 1 capsule (500 mg total)  by mouth 2 (two) times daily at 8 am and 10 pm.    Dispense:  180 capsule    Refill:  1    Do not add to the "Automatic Refill" notification system.  . gabapentin (NEURONTIN) 100 MG capsule    Sig: Take 1-3 capsules (100-300 mg total) by mouth at bedtime. Follow titration schedule.    Dispense:  270 capsule    Refill:  1    Do not place this medication, or any other prescription from our practice, on "Automatic Refill". Patient may have prescription filled one day early if pharmacy is closed on scheduled refill date.   Medications administered during this visit: Ms. [facility] had no medications administered during this visit.   Pharmacotherapy under consideration:  Opioid Analgesics: We'll concentrate on non-opioid therapy. Membrane stabilizer: Neurontin 100mg  HS Muscle relaxant: Tizanadine trial NSAID: Mobic 15 mg QD Other analgesic(s): To be determined at a later time   Interventional therapies under consideration: Ms. Mykael Trott was informed that there is no guarantee that she would be a candidate for interventional therapies. The decision will be based on the results of diagnostic studies, as well as Ms. San Miguel's risk profile.  Possible procedure(s): Cervical epidural steroid injection under fluoroscopic guidance and IV sedation for the neck pain  Bilateral lumbar facet block under fluoroscopic guidance and IV sedation for the low back pain  Palliative Left L4-5 lumbar epidural steroid injection under fluoroscopic guidance and IV sedation for the leg pain Bilateral intra-articular knee joint injection  for the knee pain    Provider-requested follow-up: Return in about 6 months (around 05/23/2017) for (MD) Med-Mgmt, in addition, (PRN) procedure.  No future appointments.  Primary Care Physician: No primary care provider on file. Location: [facility] Note by: Shanterica Biehler A. Laban Moore, M.D, DABA, DABAPM, DABPM, DABIPP, FIPP Date: 11/23/2016; Time: 1:26  PM  Pain Score Disclaimer: We use the NRS-11 scale. This is a self-reported, subjective measurement of pain severity with only modest accuracy. It is used primarily to identify changes within a particular patient. It must be understood that outpatient pain scales are significantly less accurate that those used for research, where they can be applied under ideal controlled circumstances with minimal exposure to variables. In reality, the score is likely to be a combination of pain intensity and pain affect, where pain affect describes the degree of emotional arousal or changes in action readiness caused by the sensory experience of pain. Factors such as social and work situation, setting, emotional state, anxiety levels, expectation, and prior pain experience may influence pain perception and show large inter-individual differences that may also be affected by time variables.  Patient instructions provided during this appointment: Patient Instructions   Initial Gabapentin Titration  Medication used: Gabapentin (Generic Name) or Neurontin (Brand Name) 100 mg tablets/capsules  Reasons to stop increasing the dose:  Reason 1: You get good relief of symptoms, in which case there is no need to increase the daily dose any further.    Reason 2: You develop some side effects, such as sleeping all of the time, difficulty concentrating, or becoming disoriented, in which case you need to go down on the dose, to the prior level, where you were not experiencing any side effects. Stay on that dose longer, to allow more time for your body to get use it, before attempting to increase it again.   Steps: Step 1: Start by taking 1 (one) tablet at bedtime x 7 (seven) days.  Step 2: After being on 1 (one) tablet for 7 (seven) days, then increase it to 2 (two) tablets at bedtime for another 7 (seven) days.  Step 3: Next, after being on 2 (two) tablets at bedtime for 7 (seven) days, then increase it to 3 (three)  tablets at bedtime, and stay on that dose until you see your doctor.  Reasons to stop increasing the dose: Reason 1: You get good relief of symptoms, in which case there is no need to increase the daily dose any further.  Reason 2: You develop some side effects, such as sleeping all of the time, difficulty concentrating, or becoming disoriented, in which case you need to go down on the dose, to the prior level, where you were not experiencing any side effects. Stay on that dose longer, to allow more time for your body to get use it, before attempting to increase it again.  Endpoint: Once you have reached the maximum dose you can tolerate without side-effects, contact your physician so as to evaluate the results of the regimen.   Questions: Feel free to contact us for any questions or problems at [phone] RISKS AND COMPLICATIONS  What are the risk, side effects and possible complications? Generally speaking, most procedures are safe.  However, with any procedure there are risks, side effects, and the possibility of complications.  The risks and complications are dependent upon the sites that are lesioned, or the type of nerve block to be performed.  The closer the procedure is to the spine,  the more serious the risks are.  Great care is taken when placing the radio frequency needles, block needles or lesioning probes, but sometimes complications can occur. 1. Infection: Any time there is an injection through the skin, there is a risk of infection.  This is why sterile conditions are used for these blocks.  There are four possible types of infection. 1. Localized skin infection. 2. Central Nervous System Infection-This can be in the form of Meningitis, which can be deadly. 3. Epidural Infections-This can be in the form of an epidural abscess, which can cause pressure inside of the spine, causing compression of the spinal cord with subsequent paralysis. This would require an emergency  surgery to decompress, and there are no guarantees that the patient would recover from the paralysis. 4. Discitis-This is an infection of the intervertebral discs.  It occurs in about 1% of discography procedures.  It is difficult to treat and it may lead to surgery.        2. Pain: the needles have to go through skin and soft tissues, will cause soreness.       3. Damage to internal structures:  The nerves to be lesioned may be near blood vessels or    other nerves which can be potentially damaged.       4. Bleeding: Bleeding is more common if the patient is taking blood thinners such as  aspirin, Coumadin, Ticiid, Plavix, etc., or if he/she have some genetic predisposition  such as hemophilia. Bleeding into the spinal canal can cause compression of the spinal  cord with subsequent paralysis.  This would require an emergency surgery to  decompress and there are no guarantees that the patient would recover from the  paralysis.       5. Pneumothorax:  Puncturing of a lung is a possibility, every time a needle is introduced in  the area of the chest or upper back.  Pneumothorax refers to free air around the  collapsed lung(s), inside of the thoracic cavity (chest cavity).  Another two possible  complications related to a similar event would include: Hemothorax and Chylothorax.   These are variations of the Pneumothorax, where instead of air around the collapsed  lung(s), you may have blood or chyle, respectively.       6. Spinal headaches: They may occur with any procedures in the area of the spine.       7. Persistent CSF (Cerebro-Spinal Fluid) leakage: This is a rare problem, but may occur  with prolonged intrathecal or epidural catheters either due to the formation of a fistulous  track or a dural tear.       8. Nerve damage: By working so close to the spinal cord, there is always a possibility of  nerve damage, which could be as serious as a permanent spinal cord injury with  paralysis.       9. Death:   Although rare, severe deadly allergic reactions known as "Anaphylactic  reaction" can occur to any of the medications used.      10. Worsening of the symptoms:  We can always make thing worse.  What are the chances of something like this happening? Chances of any of this occuring are extremely low.  By statistics, you have more of a chance of getting killed in a motor vehicle accident: while driving to the hospital than any of the above occurring .  Nevertheless, you should be aware that they are possibilities.  In general, it is similar to  taking a shower.  Everybody knows that you can slip, hit your head and get killed.  Does that mean that you should not shower again?  Nevertheless always keep in mind that statistics do not mean anything if you happen to be on the wrong side of them.  Even if a procedure has a 1 (one) in a 1,000,000 (million) chance of going wrong, it you happen to be that one..Also, keep in mind that by statistics, you have more of a chance of having something go wrong when taking medications.  Who should not have this procedure? If you are on a blood thinning medication (e.g. Coumadin, Plavix, see list of "Blood Thinners"), or if you have an active infection going on, you should not have the procedure.  If you are taking any blood thinners, please inform your physician.  How should I prepare for this procedure?  Do not eat or drink anything at least six hours prior to the procedure.  Bring a driver with you .  It cannot be a taxi.  Come accompanied by an adult that can drive you back, and that is strong enough to help you if your legs get weak or numb from the local anesthetic.  Take all of your medicines the morning of the procedure with just enough water to swallow them.  If you have diabetes, make sure that you are scheduled to have your procedure done first thing in the morning, whenever possible.  If you have diabetes, take only half of your insulin dose and notify our  nurse that you have done so as soon as you arrive at the clinic.  If you are diabetic, but only take blood sugar pills (oral hypoglycemic), then do not take them on the morning of your procedure.  You may take them after you have had the procedure.  Do not take aspirin or any aspirin-containing medications, at least eleven (11) days prior to the procedure.  They may prolong bleeding.  Wear loose fitting clothing that may be easy to take off and that you would not mind if it got stained with Betadine or blood.  Do not wear any jewelry or perfume  Remove any nail coloring.  It will interfere with some of our monitoring equipment.  NOTE: Remember that this is not meant to be interpreted as a complete list of all possible complications.  Unforeseen problems may occur.  BLOOD THINNERS The following drugs contain aspirin or other products, which can cause increased bleeding during surgery and should not be taken for 2 weeks prior to and 1 week after surgery.  If you should need take something for relief of minor pain, you may take acetaminophen which is found in Tylenol,m Datril, Anacin-3 and Panadol. It is not blood thinner. The products listed below are.  Do not take any of the products listed below in addition to any listed on your instruction sheet.  A.P.C or A.P.C with Codeine Codeine Phosphate Capsules #3 Ibuprofen Ridaura  ABC compound Congesprin Imuran rimadil  Advil Cope Indocin Robaxisal  Alka-Seltzer Effervescent Pain Reliever and Antacid Coricidin or Coricidin-D  Indomethacin Rufen  Alka-Seltzer plus Cold Medicine Cosprin Ketoprofen S-A-C Tablets  Anacin Analgesic Tablets or Capsules Coumadin Korlgesic Salflex  Anacin Extra Strength Analgesic tablets or capsules CP-2 Tablets Lanoril Salicylate  Anaprox Cuprimine Capsules Levenox Salocol  Anexsia-D Dalteparin Magan Salsalate  Anodynos Darvon compound Magnesium Salicylate Sine-off  Ansaid Dasin Capsules Magsal Sodium Salicylate   Anturane Depen Capsules Marnal Soma  APF Arthritis pain  formula Dewitt's Pills Measurin Stanback  Argesic Dia-Gesic Meclofenamic Sulfinpyrazone  Arthritis Bayer Timed Release Aspirin Diclofenac Meclomen Sulindac  Arthritis pain formula Anacin Dicumarol Medipren Supac  Analgesic (Safety coated) Arthralgen Diffunasal Mefanamic Suprofen  Arthritis Strength Bufferin Dihydrocodeine Mepro Compound Suprol  Arthropan liquid Dopirydamole Methcarbomol with Aspirin Synalgos  ASA tablets/Enseals Disalcid Micrainin Tagament  Ascriptin Doan's Midol Talwin  Ascriptin A/D Dolene Mobidin Tanderil  Ascriptin Extra Strength Dolobid Moblgesic Ticlid  Ascriptin with Codeine Doloprin or Doloprin with Codeine Momentum Tolectin  Asperbuf Duoprin Mono-gesic Trendar  Aspergum Duradyne Motrin or Motrin IB Triminicin  Aspirin plain, buffered or enteric coated Durasal Myochrisine Trigesic  Aspirin Suppositories Easprin Nalfon Trillsate  Aspirin with Codeine Ecotrin Regular or Extra Strength Naprosyn Uracel  Atromid-S Efficin Naproxen Ursinus  Auranofin Capsules Elmiron Neocylate Vanquish  Axotal Emagrin Norgesic Verin  Azathioprine Empirin or Empirin with Codeine Normiflo Vitamin E  Azolid Emprazil Nuprin Voltaren  Bayer Aspirin plain, buffered or children's or timed BC Tablets or powders Encaprin Orgaran Warfarin Sodium  Buff-a-Comp Enoxaparin Orudis Zorpin  Buff-a-Comp with Codeine Equegesic Os-Cal-Gesic   Buffaprin Excedrin plain, buffered or Extra Strength Oxalid   Bufferin Arthritis Strength Feldene Oxphenbutazone   Bufferin plain or Extra Strength Feldene Capsules Oxycodone with Aspirin   Bufferin with Codeine Fenoprofen Fenoprofen Pabalate or Pabalate-SF   Buffets II Flogesic Panagesic   Buffinol plain or Extra Strength Florinal or Florinal with Codeine Panwarfarin   Buf-Tabs Flurbiprofen Penicillamine   Butalbital Compound Four-way cold tablets Penicillin   Butazolidin Fragmin Pepto-Bismol    Carbenicillin Geminisyn Percodan   Carna Arthritis Reliever Geopen Persantine   Carprofen Gold's salt Persistin   Chloramphenicol Goody's Phenylbutazone   Chloromycetin Haltrain Piroxlcam   Clmetidine heparin Plaquenil   Cllnoril Hyco-pap Ponstel   Clofibrate Hydroxy chloroquine Propoxyphen         Before stopping any of these medications, be sure to consult the physician who ordered them.  Some, such as Coumadin (Warfarin) are ordered to prevent or treat serious conditions such as "deep thrombosis", "pumonary embolisms", and other heart problems.  The amount of time that you may need off of the medication may also vary with the medication and the reason for which you were taking it.  If you are taking any of these medications, please make sure you notify your pain physician before you undergo any procedures.         Facet Joint Block The facet joints connect the bones of the spine (vertebrae). They make it possible for you to bend, twist, and make other movements with your spine. They also prevent you from overbending, overtwisting, and making other excessive movements.  A facet joint block is a procedure where a numbing medicine (anesthetic) is injected into a facet joint. Often, a type of anti-inflammatory medicine called a steroid is also injected. A facet joint block may be done for two reasons:   Diagnosis. A facet joint block may be done as a test to see whether neck or back pain is caused by a worn-down or infected facet joint. If the pain gets better after a facet joint block, it means the pain is probably coming from the facet joint. If the pain does not get better, it means the pain is probably not coming from the facet joint.   Therapy. A facet joint block may be done to relieve neck or back pain caused by a facet joint. A facet joint block is only done as a therapy if the pain does not improve with  medicine, exercise programs, physical therapy, and other forms of pain  management. LET [facility] CARE PROVIDER KNOW ABOUT:   Any allergies you have.   All medicines you are taking, including vitamins, herbs, eyedrops, and over-the-counter medicines and creams.   Previous problems you or members of your family have had with the use of anesthetics.   Any blood disorders you have had.   Other health problems you have. RISKS AND COMPLICATIONS Generally, having a facet joint block is safe. However, as with any procedure, complications can occur. Possible complications associated with having a facet joint block include:   Bleeding.   Injury to a nerve near the injection site.   Pain at the injection site.   Weakness or numbness in areas controlled by nerves near the injection site.   Infection.   Temporary fluid retention.   Allergic reaction to anesthetics or medicines used during the procedure. BEFORE THE PROCEDURE   Follow your health care provider's instructions if you are taking dietary supplements or medicines. You may need to stop taking them or reduce your dosage.   Do not take any new dietary supplements or medicines without asking your health care provider first.   Follow your health care provider's instructions about eating and drinking before the procedure. You may need to stop eating and drinking several hours before the procedure.   Arrange to have an adult drive you home after the procedure. PROCEDURE  You may need to remove your clothing and dress in an open-back gown so that your health care provider can access your spine.   The procedure will be done while you are lying on an X-ray table. Most of the time you will be asked to lie on your stomach, but you may be asked to lie in a different position if an injection will be made in your neck.   Special machines will be used to monitor your oxygen levels, heart rate, and blood pressure.   If an injection will be made in your neck, an intravenous (IV) tube will be  inserted into one of your veins. Fluids and medicine will flow directly into your body through the IV tube.   The area over the facet joint where the injection will be made will be cleaned with an antiseptic soap. The surrounding skin will be covered with sterile drapes.   An anesthetic will be applied to your skin to make the injection area numb. You may feel a temporary stinging or burning sensation.   A video X-ray machine will be used to locate the joint. A contrast dye may be injected into the facet joint area to help with locating the joint.   When the joint is located, an anesthetic medicine will be injected into the joint through the needle.   Your health care provider will ask you whether you feel pain relief. If you do feel relief, a steroid may be injected to provide pain relief for a longer period of time. If you do not feel relief or feel only partial relief, additional injections of an anesthetic may be made in other facet joints.   The needle will be removed, the skin will be cleansed, and bandages will be applied.  AFTER THE PROCEDURE   You will be observed for 15-30 minutes before being allowed to go home. Do not drive. Have an adult drive you or take a taxi or public transportation instead.   If you feel pain relief, the pain will return in several hours or  days when the anesthetic wears off.   You may feel pain relief 2-14 days after the procedure. The amount of time this relief lasts varies from person to person.   It is normal to feel some tenderness over the injected area(s) for 2 days following the procedure.   If you have diabetes, you may have a temporary increase in blood sugar. This information is not intended to replace advice given to you by your health care provider. Make sure you discuss any questions you have with your health care provider. Document Released: 03/02/2007 Document Revised: 11/01/2014 Document Reviewed: 07/07/2015 [employer]  Interactive Patient Education  2017 [employer] Epidural Steroid Injection Patient Information  Description: The epidural space surrounds the nerves as they exit the spinal cord.  In some patients, the nerves can be compressed and inflamed by a bulging disc or a tight spinal canal (spinal stenosis).  By injecting steroids into the epidural space, we can bring irritated nerves into direct contact with a potentially helpful medication.  These steroids act directly on the irritated nerves and can reduce swelling and inflammation which often leads to decreased pain.  Epidural steroids may be injected anywhere along the spine and from the neck to the low back depending upon the location of your pain.   After numbing the skin with local anesthetic (like Novocaine), a small needle is passed into the epidural space slowly.  You may experience a sensation of pressure while this is being done.  The entire block usually last less than 10 minutes.  Conditions which may be treated by epidural steroids:   Low back and leg pain  Neck and arm pain  Spinal stenosis  Post-laminectomy syndrome  Herpes zoster (shingles) pain  Pain from compression fractures  Preparation for the injection:  1. Do not eat any solid food or dairy products within 8 hours of your appointment.  2. You may drink clear liquids up to 3 hours before appointment.  Clear liquids include water, black coffee, juice or soda.  No milk or cream please. 3. You may take your regular medication, including pain medications, with a sip of water before your appointment  Diabetics should hold regular insulin (if taken separately) and take 1/2 normal NPH dos the morning of the procedure.  Carry some sugar containing items with you to your appointment. 4. A driver must accompany you and be prepared to drive you home after your procedure.  5. Bring all your current medications with your. 6. An IV may be inserted and sedation may be given at the  discretion of the physician.   7. A blood pressure cuff, EKG and other monitors will often be applied during the procedure.  Some patients may need to have extra oxygen administered for a short period. 8. You will be asked to provide medical information, including your allergies, prior to the procedure.  We must know immediately if you are taking blood thinners (like Coumadin/Warfarin)  Or if you are allergic to IV iodine contrast (dye). We must know if you could possible be pregnant.  Possible side-effects:  Bleeding from needle site  Infection (rare, may require surgery)  Nerve injury (rare)  Numbness & tingling (temporary)  Difficulty urinating (rare, temporary)  Spinal headache ( a headache worse with upright posture)  Light -headedness (temporary)  Pain at injection site (several days)  Decreased blood pressure (temporary)  Weakness in arm/leg (temporary)  Pressure sensation in back/neck (temporary)  Call if you experience:  Fever/chills associated with headache or  increased back/neck pain.  Headache worsened by an upright position.  New onset weakness or numbness of an extremity below the injection site  Hives or difficulty breathing (go to the emergency room)  Inflammation or drainage at the infection site  Severe back/neck pain  Any new symptoms which are concerning to you  Please note:  Although the local anesthetic injected can often make your back or neck feel good for several hours after the injection, the pain will likely return.  It takes 3-7 days for steroids to work in the epidural space.  You may not notice any pain relief for at least that one week.  If effective, we will often do a series of three injections spaced 3-6 weeks apart to maximally decrease your pain.  After the initial series, we generally will wait several months before considering a repeat injection of the same type.  If you have any questions, please call [phone] [facility]

## 2016-11-23 NOTE — Patient Instructions (Addendum)
Initial Gabapentin Titration  Medication used: Gabapentin (Generic Name) or Neurontin (Brand Name) 100 mg tablets/capsules  Reasons to stop increasing the dose:  Reason 1: You get good relief of symptoms, in which case there is no need to increase the daily dose any further.    Reason 2: You develop some side effects, such as sleeping all of the time, difficulty concentrating, or becoming disoriented, in which case you need to go down on the dose, to the prior level, where you were not experiencing any side effects. Stay on that dose longer, to allow more time for your body to get use it, before attempting to increase it again.   Steps: Step 1: Start by taking 1 (one) tablet at bedtime x 7 (seven) days.  Step 2: After being on 1 (one) tablet for 7 (seven) days, then increase it to 2 (two) tablets at bedtime for another 7 (seven) days.  Step 3: Next, after being on 2 (two) tablets at bedtime for 7 (seven) days, then increase it to 3 (three) tablets at bedtime, and stay on that dose until you see your doctor.  Reasons to stop increasing the dose: Reason 1: You get good relief of symptoms, in which case there is no need to increase the daily dose any further.  Reason 2: You develop some side effects, such as sleeping all of the time, difficulty concentrating, or becoming disoriented, in which case you need to go down on the dose, to the prior level, where you were not experiencing any side effects. Stay on that dose longer, to allow more time for your body to get use it, before attempting to increase it again.  Endpoint: Once you have reached the maximum dose you can tolerate without side-effects, contact your physician so as to evaluate the results of the regimen.   Questions: Feel free to contact us for any questions or problems at [phone] RISKS AND COMPLICATIONS  What are the risk, side effects and possible complications? Generally speaking, most procedures are safe.   However, with any procedure there are risks, side effects, and the possibility of complications.  The risks and complications are dependent upon the sites that are lesioned, or the type of nerve block to be performed.  The closer the procedure is to the spine, the more serious the risks are.  Great care is taken when placing the radio frequency needles, block needles or lesioning probes, but sometimes complications can occur. 1. Infection: Any time there is an injection through the skin, there is a risk of infection.  This is why sterile conditions are used for these blocks.  There are four possible types of infection. 1. Localized skin infection. 2. Central Nervous System Infection-This can be in the form of Meningitis, which can be deadly. 3. Epidural Infections-This can be in the form of an epidural abscess, which can cause pressure inside of the spine, causing compression of the spinal cord with subsequent paralysis. This would require an emergency surgery to decompress, and there are no guarantees that the patient would recover from the paralysis. 4. Discitis-This is an infection of the intervertebral discs.  It occurs in about 1% of discography procedures.  It is difficult to treat and it may lead to surgery.        2. Pain: the needles have to go through skin and soft tissues, will cause soreness.       3. Damage to internal structures:  The nerves to be lesioned may be near blood  vessels or    other nerves which can be potentially damaged.       4. Bleeding: Bleeding is more common if the patient is taking blood thinners such as  aspirin, Coumadin, Ticiid, Plavix, etc., or if he/she have some genetic predisposition  such as hemophilia. Bleeding into the spinal canal can cause compression of the spinal  cord with subsequent paralysis.  This would require an emergency surgery to  decompress and there are no guarantees that the patient would recover from the  paralysis.       5. Pneumothorax:   Puncturing of a lung is a possibility, every time a needle is introduced in  the area of the chest or upper back.  Pneumothorax refers to free air around the  collapsed lung(s), inside of the thoracic cavity (chest cavity).  Another two possible  complications related to a similar event would include: Hemothorax and Chylothorax.   These are variations of the Pneumothorax, where instead of air around the collapsed  lung(s), you may have blood or chyle, respectively.       6. Spinal headaches: They may occur with any procedures in the area of the spine.       7. Persistent CSF (Cerebro-Spinal Fluid) leakage: This is a rare problem, but may occur  with prolonged intrathecal or epidural catheters either due to the formation of a fistulous  track or a dural tear.       8. Nerve damage: By working so close to the spinal cord, there is always a possibility of  nerve damage, which could be as serious as a permanent spinal cord injury with  paralysis.       9. Death:  Although rare, severe deadly allergic reactions known as "Anaphylactic  reaction" can occur to any of the medications used.      10. Worsening of the symptoms:  We can always make thing worse.  What are the chances of something like this happening? Chances of any of this occuring are extremely low.  By statistics, you have more of a chance of getting killed in a motor vehicle accident: while driving to the hospital than any of the above occurring .  Nevertheless, you should be aware that they are possibilities.  In general, it is similar to taking a shower.  Everybody knows that you can slip, hit your head and get killed.  Does that mean that you should not shower again?  Nevertheless always keep in mind that statistics do not mean anything if you happen to be on the wrong side of them.  Even if a procedure has a 1 (one) in a 1,000,000 (million) chance of going wrong, it you happen to be that one..Also, keep in mind that by statistics, you have more of  a chance of having something go wrong when taking medications.  Who should not have this procedure? If you are on a blood thinning medication (e.g. Coumadin, Plavix, see list of "Blood Thinners"), or if you have an active infection going on, you should not have the procedure.  If you are taking any blood thinners, please inform your physician.  How should I prepare for this procedure?  Do not eat or drink anything at least six hours prior to the procedure.  Bring a driver with you .  It cannot be a taxi.  Come accompanied by an adult that can drive you back, and that is strong enough to help you if your legs get weak or numb from  the local anesthetic.  Take all of your medicines the morning of the procedure with just enough water to swallow them.  If you have diabetes, make sure that you are scheduled to have your procedure done first thing in the morning, whenever possible.  If you have diabetes, take only half of your insulin dose and notify our nurse that you have done so as soon as you arrive at the clinic.  If you are diabetic, but only take blood sugar pills (oral hypoglycemic), then do not take them on the morning of your procedure.  You may take them after you have had the procedure.  Do not take aspirin or any aspirin-containing medications, at least eleven (11) days prior to the procedure.  They may prolong bleeding.  Wear loose fitting clothing that may be easy to take off and that you would not mind if it got stained with Betadine or blood.  Do not wear any jewelry or perfume  Remove any nail coloring.  It will interfere with some of our monitoring equipment.  NOTE: Remember that this is not meant to be interpreted as a complete list of all possible complications.  Unforeseen problems may occur.  BLOOD THINNERS The following drugs contain aspirin or other products, which can cause increased bleeding during surgery and should not be taken for 2 weeks prior to and 1 week  after surgery.  If you should need take something for relief of minor pain, you may take acetaminophen which is found in Tylenol,m Datril, Anacin-3 and Panadol. It is not blood thinner. The products listed below are.  Do not take any of the products listed below in addition to any listed on your instruction sheet.  A.P.C or A.P.C with Codeine Codeine Phosphate Capsules #3 Ibuprofen Ridaura  ABC compound Congesprin Imuran rimadil  Advil Cope Indocin Robaxisal  Alka-Seltzer Effervescent Pain Reliever and Antacid Coricidin or Coricidin-D  Indomethacin Rufen  Alka-Seltzer plus Cold Medicine Cosprin Ketoprofen S-A-C Tablets  Anacin Analgesic Tablets or Capsules Coumadin Korlgesic Salflex  Anacin Extra Strength Analgesic tablets or capsules CP-2 Tablets Lanoril Salicylate  Anaprox Cuprimine Capsules Levenox Salocol  Anexsia-D Dalteparin Magan Salsalate  Anodynos Darvon compound Magnesium Salicylate Sine-off  Ansaid Dasin Capsules Magsal Sodium Salicylate  Anturane Depen Capsules Marnal Soma  APF Arthritis pain formula Dewitt's Pills Measurin Stanback  Argesic Dia-Gesic Meclofenamic Sulfinpyrazone  Arthritis Bayer Timed Release Aspirin Diclofenac Meclomen Sulindac  Arthritis pain formula Anacin Dicumarol Medipren Supac  Analgesic (Safety coated) Arthralgen Diffunasal Mefanamic Suprofen  Arthritis Strength Bufferin Dihydrocodeine Mepro Compound Suprol  Arthropan liquid Dopirydamole Methcarbomol with Aspirin Synalgos  ASA tablets/Enseals Disalcid Micrainin Tagament  Ascriptin Doan's Midol Talwin  Ascriptin A/D Dolene Mobidin Tanderil  Ascriptin Extra Strength Dolobid Moblgesic Ticlid  Ascriptin with Codeine Doloprin or Doloprin with Codeine Momentum Tolectin  Asperbuf Duoprin Mono-gesic Trendar  Aspergum Duradyne Motrin or Motrin IB Triminicin  Aspirin plain, buffered or enteric coated Durasal Myochrisine Trigesic  Aspirin Suppositories Easprin Nalfon Trillsate  Aspirin with Codeine Ecotrin  Regular or Extra Strength Naprosyn Uracel  Atromid-S Efficin Naproxen Ursinus  Auranofin Capsules Elmiron Neocylate Vanquish  Axotal Emagrin Norgesic Verin  Azathioprine Empirin or Empirin with Codeine Normiflo Vitamin E  Azolid Emprazil Nuprin Voltaren  Bayer Aspirin plain, buffered or children's or timed BC Tablets or powders Encaprin Orgaran Warfarin Sodium  Buff-a-Comp Enoxaparin Orudis Zorpin  Buff-a-Comp with Codeine Equegesic Os-Cal-Gesic   Buffaprin Excedrin plain, buffered or Extra Strength Oxalid   Bufferin Arthritis Strength Feldene Oxphenbutazone   Bufferin plain  or Extra Strength Feldene Capsules Oxycodone with Aspirin   Bufferin with Codeine Fenoprofen Fenoprofen Pabalate or Pabalate-SF   Buffets II Flogesic Panagesic   Buffinol plain or Extra Strength Florinal or Florinal with Codeine Panwarfarin   Buf-Tabs Flurbiprofen Penicillamine   Butalbital Compound Four-way cold tablets Penicillin   Butazolidin Fragmin Pepto-Bismol   Carbenicillin Geminisyn Percodan   Carna Arthritis Reliever Geopen Persantine   Carprofen Gold's salt Persistin   Chloramphenicol Goody's Phenylbutazone   Chloromycetin Haltrain Piroxlcam   Clmetidine heparin Plaquenil   Cllnoril Hyco-pap Ponstel   Clofibrate Hydroxy chloroquine Propoxyphen         Before stopping any of these medications, be sure to consult the physician who ordered them.  Some, such as Coumadin (Warfarin) are ordered to prevent or treat serious conditions such as "deep thrombosis", "pumonary embolisms", and other heart problems.  The amount of time that you may need off of the medication may also vary with the medication and the reason for which you were taking it.  If you are taking any of these medications, please make sure you notify your pain physician before you undergo any procedures.         Facet Joint Block The facet joints connect the bones of the spine (vertebrae). They make it possible for you to bend, twist, and  make other movements with your spine. They also prevent you from overbending, overtwisting, and making other excessive movements.  A facet joint block is a procedure where a numbing medicine (anesthetic) is injected into a facet joint. Often, a type of anti-inflammatory medicine called a steroid is also injected. A facet joint block may be done for two reasons:   Diagnosis. A facet joint block may be done as a test to see whether neck or back pain is caused by a worn-down or infected facet joint. If the pain gets better after a facet joint block, it means the pain is probably coming from the facet joint. If the pain does not get better, it means the pain is probably not coming from the facet joint.   Therapy. A facet joint block may be done to relieve neck or back pain caused by a facet joint. A facet joint block is only done as a therapy if the pain does not improve with medicine, exercise programs, physical therapy, and other forms of pain management. LET [facility] CARE PROVIDER KNOW ABOUT:   Any allergies you have.   All medicines you are taking, including vitamins, herbs, eyedrops, and over-the-counter medicines and creams.   Previous problems you or members of your family have had with the use of anesthetics.   Any blood disorders you have had.   Other health problems you have. RISKS AND COMPLICATIONS Generally, having a facet joint block is safe. However, as with any procedure, complications can occur. Possible complications associated with having a facet joint block include:   Bleeding.   Injury to a nerve near the injection site.   Pain at the injection site.   Weakness or numbness in areas controlled by nerves near the injection site.   Infection.   Temporary fluid retention.   Allergic reaction to anesthetics or medicines used during the procedure. BEFORE THE PROCEDURE   Follow your health care provider's instructions if you are taking dietary supplements or  medicines. You may need to stop taking them or reduce your dosage.   Do not take any new dietary supplements or medicines without asking your health care provider first.  Follow your health care provider's instructions about eating and drinking before the procedure. You may need to stop eating and drinking several hours before the procedure.   Arrange to have an adult drive you home after the procedure. PROCEDURE  You may need to remove your clothing and dress in an open-back gown so that your health care provider can access your spine.   The procedure will be done while you are lying on an X-ray table. Most of the time you will be asked to lie on your stomach, but you may be asked to lie in a different position if an injection will be made in your neck.   Special machines will be used to monitor your oxygen levels, heart rate, and blood pressure.   If an injection will be made in your neck, an intravenous (IV) tube will be inserted into one of your veins. Fluids and medicine will flow directly into your body through the IV tube.   The area over the facet joint where the injection will be made will be cleaned with an antiseptic soap. The surrounding skin will be covered with sterile drapes.   An anesthetic will be applied to your skin to make the injection area numb. You may feel a temporary stinging or burning sensation.   A video X-ray machine will be used to locate the joint. A contrast dye may be injected into the facet joint area to help with locating the joint.   When the joint is located, an anesthetic medicine will be injected into the joint through the needle.   Your health care provider will ask you whether you feel pain relief. If you do feel relief, a steroid may be injected to provide pain relief for a longer period of time. If you do not feel relief or feel only partial relief, additional injections of an anesthetic may be made in other facet joints.   The needle  will be removed, the skin will be cleansed, and bandages will be applied.  AFTER THE PROCEDURE   You will be observed for 15-30 minutes before being allowed to go home. Do not drive. Have an adult drive you or take a taxi or public transportation instead.   If you feel pain relief, the pain will return in several hours or days when the anesthetic wears off.   You may feel pain relief 2-14 days after the procedure. The amount of time this relief lasts varies from person to person.   It is normal to feel some tenderness over the injected area(s) for 2 days following the procedure.   If you have diabetes, you may have a temporary increase in blood sugar. This information is not intended to replace advice given to you by your health care provider. Make sure you discuss any questions you have with your health care provider. Document Released: 03/02/2007 Document Revised: 11/01/2014 Document Reviewed: 07/07/2015 [employer]vier Interactive Patient Education  2017 [employer] Epidural Steroid Injection Patient Information  Description: The epidural space surrounds the nerves as they exit the spinal cord.  In some patients, the nerves can be compressed and inflamed by a bulging disc or a tight spinal canal (spinal stenosis).  By injecting steroids into the epidural space, we can bring irritated nerves into direct contact with a potentially helpful medication.  These steroids act directly on the irritated nerves and can reduce swelling and inflammation which often leads to decreased pain.  Epidural steroids may be injected anywhere along the spine and from the  neck to the low back depending upon the location of your pain.   After numbing the skin with local anesthetic (like Novocaine), a small needle is passed into the epidural space slowly.  You may experience a sensation of pressure while this is being done.  The entire block usually last less than 10 minutes.  Conditions which may be treated by  epidural steroids:   Low back and leg pain  Neck and arm pain  Spinal stenosis  Post-laminectomy syndrome  Herpes zoster (shingles) pain  Pain from compression fractures  Preparation for the injection:  1. Do not eat any solid food or dairy products within 8 hours of your appointment.  2. You may drink clear liquids up to 3 hours before appointment.  Clear liquids include water, black coffee, juice or soda.  No milk or cream please. 3. You may take your regular medication, including pain medications, with a sip of water before your appointment  Diabetics should hold regular insulin (if taken separately) and take 1/2 normal NPH dos the morning of the procedure.  Carry some sugar containing items with you to your appointment. 4. A driver must accompany you and be prepared to drive you home after your procedure.  5. Bring all your current medications with your. 6. An IV may be inserted and sedation may be given at the discretion of the physician.   7. A blood pressure cuff, EKG and other monitors will often be applied during the procedure.  Some patients may need to have extra oxygen administered for a short period. 8. You will be asked to provide medical information, including your allergies, prior to the procedure.  We must know immediately if you are taking blood thinners (like Coumadin/Warfarin)  Or if you are allergic to IV iodine contrast (dye). We must know if you could possible be pregnant.  Possible side-effects:  Bleeding from needle site  Infection (rare, may require surgery)  Nerve injury (rare)  Numbness & tingling (temporary)  Difficulty urinating (rare, temporary)  Spinal headache ( a headache worse with upright posture)  Light -headedness (temporary)  Pain at injection site (several days)  Decreased blood pressure (temporary)  Weakness in arm/leg (temporary)  Pressure sensation in back/neck (temporary)  Call if you experience:  Fever/chills associated  with headache or increased back/neck pain.  Headache worsened by an upright position.  New onset weakness or numbness of an extremity below the injection site  Hives or difficulty breathing (go to the emergency room)  Inflammation or drainage at the infection site  Severe back/neck pain  Any new symptoms which are concerning to you  Please note:  Although the local anesthetic injected can often make your back or neck feel good for several hours after the injection, the pain will likely return.  It takes 3-7 days for steroids to work in the epidural space.  You may not notice any pain relief for at least that one week.  If effective, we will often do a series of three injections spaced 3-6 weeks apart to maximally decrease your pain.  After the initial series, we generally will wait several months before considering a repeat injection of the same type.  If you have any questions, please call [phone] [facility]

## 2016-11-24 ENCOUNTER — Telehealth: Payer: Self-pay | Admitting: Pain Medicine

## 2016-11-24 NOTE — Telephone Encounter (Signed)
Returned call, gave the patient's name, phone number, birthdate, and case ID. The person who answered said they do not have the patient in their system.

## 2016-11-24 NOTE — Telephone Encounter (Signed)
Carl[place] from [employer] [place] called regarding authorization to fill script for patient, need to speak with nurse or Dr. Laban EmperorNaveira  Call [phone] or [phone] Case # is [id number]

## 2016-11-30 ENCOUNTER — Other Ambulatory Visit: Payer: Self-pay | Admitting: Pain Medicine

## 2016-11-30 DIAGNOSIS — M5442 Lumbago with sciatica, left side: Principal | ICD-10-CM

## 2016-11-30 DIAGNOSIS — G8929 Other chronic pain: Secondary | ICD-10-CM

## 2016-11-30 MED ORDER — TIZANIDINE HCL 2 MG PO TABS: 2.0000 mg | ORAL_TABLET | Freq: Every day | ORAL | 0 refills | Status: DC

## 2016-12-06 ENCOUNTER — Ambulatory Visit
Admission: RE | Admit: 2016-12-06 | Discharge: 2016-12-06 | Disposition: A | Discharge status: Home / Self Care | Payer: Medicare PPO | Source: Ambulatory Visit | Attending: Pain Medicine | Admitting: Pain Medicine

## 2016-12-06 DIAGNOSIS — M79605 Pain in left leg: Secondary | ICD-10-CM | POA: Insufficient documentation

## 2016-12-06 DIAGNOSIS — M5442 Lumbago with sciatica, left side: Secondary | ICD-10-CM | POA: Diagnosis present

## 2016-12-06 DIAGNOSIS — G8929 Other chronic pain: Secondary | ICD-10-CM | POA: Insufficient documentation

## 2016-12-06 DIAGNOSIS — M4310 Spondylolisthesis, site unspecified: Secondary | ICD-10-CM | POA: Insufficient documentation

## 2016-12-06 DIAGNOSIS — M48061 Spinal stenosis, lumbar region without neurogenic claudication: Secondary | ICD-10-CM | POA: Diagnosis not present

## 2017-01-05 ENCOUNTER — Other Ambulatory Visit: Payer: Self-pay | Admitting: Pain Medicine

## 2017-01-05 DIAGNOSIS — E559 Vitamin D deficiency, unspecified: Secondary | ICD-10-CM

## 2017-01-05 DIAGNOSIS — R700 Elevated erythrocyte sedimentation rate: Secondary | ICD-10-CM | POA: Insufficient documentation

## 2017-01-05 MED ORDER — VITAMIN D (ERGOCALCIFEROL) 1.25 MG (50000 UNIT) PO CAPS: ORAL_CAPSULE | ORAL | 0 refills | Status: DC

## 2017-01-05 MED ORDER — OVER THE COUNTER MEDICATION: 2400.0000 mg | Freq: Every day | 0 refills | Status: AC

## 2017-01-05 MED ORDER — VITAMIN D3 50 MCG (2000 UT) PO CAPS: ORAL_CAPSULE | ORAL | 99 refills | Status: DC

## 2017-01-05 NOTE — Progress Notes (Signed)
Reason for ordering the test: To determine the cause of the diffuse arthralgias & myalgias. Finding(s): Low Vitamin D levels Explanation of findings:  Low Vitamin D Results: Normal levels: between 30 and 100 ng/mL. Vitamin D Insufficiency: Levels between 20-30 ng/ml are defined as a "Vitamin D insufficiency". Vitamin D Deficiency: Levels below 20 ng/ml, is diagnosed as a "Vitamin D Deficiency". Common causes include: dietary insufficiency; inadequate sun exposure; inability to absorb vitamin D from the intestines; or inability to process it due to kidney or liver disease. Low 25-hydroxyvitamin D: A low blood level of 25-hydroxyvitamin D may mean that a person is not getting enough exposure to sunlight or enough dietary vitamin D to meet his or her body's demand or that there is a problem with its absorption from the intestines. Occasionally, drugs used to treat seizures, particularly phenytoin (Dilantin), can interfere with the production of 25-hydroxyvitamin D in the liver. There is some evidence that vitamin D deficiency may increase the risk of some cancers, immune diseases, and cardiovascular disease. Low 1,25-dihydroxyvitamin D: A low level of 1,25-dihydroxyvitamin D can be seen in kidney disease and is one of the earliest changes to occur in persons with early kidney failure. Associated complications may include: hypocalcemia, hypophosphatemia, and reduced bone density. Associated symptoms: Vitamin D deficiencies and insufficiencies may be associated with fatigue, weakness, bone pain, joint pain, and muscle pain. Recommendation(s): Patient may benefit from taking over-the-counter Vitamin D3 supplements. I recommend a vitamin D + Calcium supplements. "Natures Bounty", a brand easily found in most pharmacies, has a formulation containing Calcium 1200 mg plus Vitamin D3 1000 IU, in Softgels capsules that are easy to swallow. This should be taken once a day, preferably in the morning as vitamin D will  increase energy levels and make it difficult to fall asleep, if taken at night. Patients with levels lower than 20 ng/ml should contact their primary care physicians to receive replacement therapy. Vitamin D3 can be obtained over-the-counter, without a prescription. Vitamin D2 requires a prescription and it is used for replacement therapy.  Normal Vitamin B-12 level(s): are between 180 and 914 pg/mL.  Elevated Vit B-12 level(s): Levels above 914 pg/mL. Possible causes: Taking supplements of vitamin B-12 (cobalamin). Medical conditions that can increase levels of vitamin B12 include: liver disease, kidney failure and myeloproliferative disorders, which includes myelocytic leukemia and polycythemia vera. Recommendations: Stop vitamin B-12 supplements. Contact primary care physician for further evaluation and recommendations.  - A normal sedimentation rate should be below 30 mm/hr. The sed rate is an acute phase reactant that indirectly measures the degree of inflammation present in the body. It can be acute, developing rapidly after trauma, injury or infection, for example, or can occur over an extended time (chronic) with conditions such as autoimmune diseases or cancer. The ESR is not diagnostic; it is a non-specific, screening test that may be elevated in a number of these different conditions. It provides general information about the presence or absence of an inflammatory condition.

## 2017-07-29 ENCOUNTER — Other Ambulatory Visit: Payer: Self-pay | Admitting: Pain Medicine

## 2017-07-29 DIAGNOSIS — E7800 Pure hypercholesterolemia, unspecified: Secondary | ICD-10-CM

## 2017-08-08 ENCOUNTER — Telehealth: Payer: Self-pay | Admitting: *Deleted

## 2017-08-08 NOTE — Telephone Encounter (Signed)
Called to [facility] to let them know that patient will need an appt in order to have prescription refilled.

## 2018-05-11 ENCOUNTER — Other Ambulatory Visit: Payer: Self-pay | Admitting: Pain Medicine

## 2018-05-11 DIAGNOSIS — M810 Age-related osteoporosis without current pathological fracture: Secondary | ICD-10-CM

## 2018-05-18 ENCOUNTER — Other Ambulatory Visit: Payer: Self-pay | Admitting: Pain Medicine

## 2018-05-18 ENCOUNTER — Telehealth: Payer: Self-pay | Admitting: *Deleted

## 2018-05-18 DIAGNOSIS — M810 Age-related osteoporosis without current pathological fracture: Secondary | ICD-10-CM

## 2018-05-18 MED ORDER — ALENDRONATE SODIUM 70 MG PO TABS: 70.0000 mg | ORAL_TABLET | ORAL | 0 refills | Status: DC

## 2018-05-18 NOTE — Telephone Encounter (Signed)
Note given to Dr. Naveira/ 

## 2018-05-30 ENCOUNTER — Other Ambulatory Visit: Payer: Self-pay | Admitting: Ophthalmology

## 2018-06-02 ENCOUNTER — Other Ambulatory Visit: Payer: Self-pay | Admitting: Ophthalmology

## 2018-06-15 ENCOUNTER — Other Ambulatory Visit: Payer: Self-pay | Admitting: Ophthalmology

## 2018-06-15 DIAGNOSIS — H532 Diplopia: Secondary | ICD-10-CM

## 2018-06-27 ENCOUNTER — Other Ambulatory Visit: Payer: Medicare PPO

## 2018-06-27 ENCOUNTER — Inpatient Hospital Stay
Admission: RE | Admit: 2018-06-27 | Discharge: 2018-06-27 | Disposition: A | Discharge status: Home / Self Care | Payer: Medicare PPO | Source: Ambulatory Visit | Attending: Ophthalmology | Admitting: Ophthalmology

## 2018-07-04 ENCOUNTER — Encounter: Payer: Self-pay | Admitting: Pain Medicine

## 2018-07-04 ENCOUNTER — Ambulatory Visit: Payer: Medicare PPO | Attending: Pain Medicine | Admitting: Pain Medicine

## 2018-07-04 ENCOUNTER — Other Ambulatory Visit: Payer: Self-pay

## 2018-07-04 VITALS — BP 181/74 | HR 57 | Temp 98.3°F | Resp 16 | Ht 62.0 in | Wt 142.0 lb

## 2018-07-04 DIAGNOSIS — M150 Primary generalized (osteo)arthritis: Secondary | ICD-10-CM

## 2018-07-04 DIAGNOSIS — M1712 Unilateral primary osteoarthritis, left knee: Secondary | ICD-10-CM | POA: Insufficient documentation

## 2018-07-04 DIAGNOSIS — M1711 Unilateral primary osteoarthritis, right knee: Secondary | ICD-10-CM | POA: Insufficient documentation

## 2018-07-04 DIAGNOSIS — M4802 Spinal stenosis, cervical region: Secondary | ICD-10-CM | POA: Insufficient documentation

## 2018-07-04 DIAGNOSIS — M5137 Other intervertebral disc degeneration, lumbosacral region: Secondary | ICD-10-CM | POA: Insufficient documentation

## 2018-07-04 DIAGNOSIS — M25562 Pain in left knee: Secondary | ICD-10-CM | POA: Diagnosis present

## 2018-07-04 DIAGNOSIS — M159 Polyosteoarthritis, unspecified: Secondary | ICD-10-CM

## 2018-07-04 DIAGNOSIS — M51379 Other intervertebral disc degeneration, lumbosacral region without mention of lumbar back pain or lower extremity pain: Secondary | ICD-10-CM | POA: Insufficient documentation

## 2018-07-04 DIAGNOSIS — G8929 Other chronic pain: Secondary | ICD-10-CM | POA: Diagnosis not present

## 2018-07-04 DIAGNOSIS — M5030 Other cervical disc degeneration, unspecified cervical region: Secondary | ICD-10-CM | POA: Insufficient documentation

## 2018-07-04 DIAGNOSIS — M25561 Pain in right knee: Secondary | ICD-10-CM

## 2018-07-04 DIAGNOSIS — M47816 Spondylosis without myelopathy or radiculopathy, lumbar region: Secondary | ICD-10-CM | POA: Insufficient documentation

## 2018-07-04 MED ORDER — METHYLPREDNISOLONE ACETATE 80 MG/ML IJ SUSP
80.0000 mg | Freq: Once | INTRAMUSCULAR | Status: AC
  Administered 2018-07-04: 80 mg via INTRA_ARTICULAR

## 2018-07-04 MED ORDER — LIDOCAINE HCL (PF) 1 % IJ SOLN: 5.0000 mL | Freq: Once | INTRAMUSCULAR | Status: DC

## 2018-07-04 MED ORDER — DICLOFENAC SODIUM 1 % TD CREA: 4.0000 g | TOPICAL_CREAM | Freq: Four times a day (QID) | TRANSDERMAL | 5 refills | Status: DC | PRN

## 2018-07-04 MED ORDER — ROPIVACAINE HCL 2 MG/ML IJ SOLN
2.0000 mL | Freq: Once | INTRAMUSCULAR | Status: AC
  Administered 2018-07-04: 4 mL via INTRA_ARTICULAR

## 2018-07-04 NOTE — Progress Notes (Signed)
Safety precautions to be maintained throughout the outpatient stay will include: orient to surroundings, keep bed in low position, maintain call bell within reach at all times, provide assistance with transfer out of bed and ambulation.  

## 2018-07-04 NOTE — Progress Notes (Signed)
Patient's Name: Virginia Moore  MRN: [id number]  Referring Provider: No ref. provider found  DOB: [birth year]  PCP: System, Pcp Not In  DOS: 07/04/2018  Note by: Oswaldo Done, MD  Service setting: Ambulatory outpatient  Specialty: Interventional Pain Management  Patient type: Established  Location: [facility] (AMB) Pain Management Facility  Visit type: Interventional Procedure   Primary Reason for Visit: Interventional Pain Management Treatment. CC: Knee Pain (left)  Procedure:          Anesthesia, Analgesia, Anxiolysis:  Type: Diagnostic Intra-Articular Local anesthetic and steroid Knee Injection #2  Region: Medial infrapatellar Knee Region Level: Knee Joint Laterality: Left knee  Type: Local Anesthesia Indication(s): Analgesia         Local Anesthetic: Lidocaine 1-2% Route: Infiltration ([place]/IM) IV Access: Declined Sedation: Declined    Indications: 1. Chronic knee pain (Left)   2. Osteoarthritis of knee (Left)   3. Osteoarthritis involving multiple joints    Pain Score: Pre-procedure: 1 /10 Post-procedure: 0-No pain/10  Pre-op Assessment:  Ms. Virginia Moore is a 82 y.o. (year old), female patient, seen today for interventional treatment. She  has a past surgical history that includes Abdominal hysterectomy. Ms. Virginia Moore has a current medication list which includes the following prescription(s): alendronate, vitamin d3, vitamin d (ergocalciferol), aspirin, cholecalciferol, vitamin d3, diclofenac sodium, famotidine-calcium carbonate-magnesium hydroxide, gabapentin, glucosamine-chondroitin, losartan-hydrochlorothiazide, magnesium oxide, meloxicam, multivitamin, prednisone, simvastatin, tizanidine, vitamin c, and vitamin d (ergocalciferol), and the following Facility-Administered Medications: lidocaine (pf). Her primarily concern today is the Knee Pain (left)  Initial Vital Signs:  Pulse/HCG Rate: (!) 57  Temp: 98.3 F (36.8 C) Resp: 16 BP: (!) 181/74 SpO2: 97 %  BMI: Estimated  body mass index is 25.97 kg/m as calculated from the following:   Height as of this encounter: 5\' 2"  (1.575 m).   Weight as of this encounter: 142 lb (64.4 kg).  Risk Assessment: Allergies: Reviewed. She has No Known Allergies.  Allergy Precautions: None required Coagulopathies: Reviewed. None identified.  Blood-thinner therapy: None at this time Active Infection(s): Reviewed. None identified. Ms. Virginia Moore is afebrile  Site Confirmation: Ms. Virginia Moore was asked to confirm the procedure and laterality before marking the site Procedure checklist: Completed Consent: Before the procedure and under the influence of no sedative(s), amnesic(s), or anxiolytics, the patient was informed of the treatment options, risks and possible complications. To fulfill our ethical and legal obligations, as recommended by the American Medical Association's Code of Ethics, I have informed the patient of my clinical impression; the nature and purpose of the treatment or procedure; the risks, benefits, and possible complications of the intervention; the alternatives, including doing nothing; the risk(s) and benefit(s) of the alternative treatment(s) or procedure(s); and the risk(s) and benefit(s) of doing nothing. The patient was provided information about the general risks and possible complications associated with the procedure. These may include, but are not limited to: failure to achieve desired goals, infection, bleeding, organ or nerve damage, allergic reactions, paralysis, and death. In addition, the patient was informed of those risks and complications associated to the procedure, such as failure to decrease pain; infection; bleeding; organ or nerve damage with subsequent damage to sensory, motor, and/or autonomic systems, resulting in permanent pain, numbness, and/or weakness of one or several areas of the body; allergic reactions; (i.e.: anaphylactic reaction); and/or death. Furthermore, the patient was informed  of those risks and complications associated with the medications. These include, but are not limited to: allergic reactions (i.e.: anaphylactic or anaphylactoid reaction(s));  adrenal axis suppression; blood sugar elevation that in diabetics may result in ketoacidosis or comma; water retention that in patients with history of congestive heart failure may result in shortness of breath, pulmonary edema, and decompensation with resultant heart failure; weight gain; swelling or edema; medication-induced neural toxicity; particulate matter embolism and blood vessel occlusion with resultant organ, and/or nervous system infarction; and/or aseptic necrosis of one or more joints. Finally, the patient was informed that Medicine is not an exact science; therefore, there is also the possibility of unforeseen or unpredictable risks and/or possible complications that may result in a catastrophic outcome. The patient indicated having understood very clearly. We have given the patient no guarantees and we have made no promises. Enough time was given to the patient to ask questions, all of which were answered to the patient's satisfaction. Ms. Virginia Moore has indicated that she wanted to continue with the procedure. Attestation: I, the ordering provider, attest that I have discussed with the patient the benefits, risks, side-effects, alternatives, likelihood of achieving goals, and potential problems during recovery for the procedure that I have provided informed consent. Date  Time: 07/04/2018  1:44 PM  Pre-Procedure Preparation:  Monitoring: As per clinic protocol. Respiration, ETCO2, SpO2, BP, heart rate and rhythm monitor placed and checked for adequate function Safety Precautions: Patient was assessed for positional comfort and pressure points before starting the procedure. Time-out: I initiated and conducted the "Time-out" before starting the procedure, as per protocol. The patient was asked to participate by confirming  the accuracy of the "Time Out" information. Verification of the correct person, site, and procedure were performed and confirmed by me, the nursing staff, and the patient. "Time-out" conducted as per Joint Commission's Universal Protocol (UP.01.01.01). Time: 1416  Description of Procedure:          Position: Sitting Target Area: Knee Joint Approach: Just above the Lateral tibial plateau, lateral to the infrapatellar tendon. Area Prepped: Entire knee area, from the mid-thigh to the mid-shin. Prepping solution: ChloraPrep (2% chlorhexidine gluconate and 70% isopropyl alcohol) Safety Precautions: Aspiration looking for blood return was conducted prior to all injections. At no point did we inject any substances, as a needle was being advanced. No attempts were made at seeking any paresthesias. Safe injection practices and needle disposal techniques used. Medications properly checked for expiration dates. SDV (single dose vial) medications used. Description of the Procedure: Protocol guidelines were followed. The patient was placed in position over the fluoroscopy table. The target area was identified and the area prepped in the usual manner. Skin & deeper tissues infiltrated with local anesthetic. Appropriate amount of time allowed to pass for local anesthetics to take effect. The procedure needles were then advanced to the target area. Proper needle placement secured. Negative aspiration confirmed. Solution injected in intermittent fashion, asking for systemic symptoms every 0.5cc of injectate. The needles were then removed and the area cleansed, making sure to leave some of the prepping solution back to take advantage of its long term bactericidal properties. Vitals:   07/04/18 1342  BP: (!) 181/74  Pulse: (!) 57  Resp: 16  Temp: 98.3 F (36.8 C)  TempSrc: Oral  SpO2: 97%  Weight: 142 lb (64.4 kg)  Height: 5\' 2"  (1.575 m)    Start Time: 1416 hrs. End Time: 1417 hrs. Materials:  Needle(s)  Type: Regular needle Gauge: 25G Length: 1.5-in Medication(s): Please see orders for medications and dosing details. Imaging Guidance:          Type of Imaging  Technique: None used Indication(s): N/A Exposure Time: No patient exposure Contrast: None used. Fluoroscopic Guidance: N/A Ultrasound Guidance: N/A Interpretation: N/A  Antibiotic Prophylaxis:   Anti-infectives (From admission, onward)   None     Indication(s): None identified  Post-operative Assessment:  Post-procedure Vital Signs:  Pulse/HCG Rate: (!) 57  Temp: 98.3 F (36.8 C) Resp: 16 BP: (!) 181/74 SpO2: 97 %  EBL: None  Complications: No immediate post-treatment complications observed by team, or reported by patient.  Note: The patient tolerated the entire procedure well. A repeat set of vitals were taken after the procedure and the patient was kept under observation following institutional policy, for this type of procedure. Post-procedural neurological assessment was performed, showing return to baseline, prior to discharge. The patient was provided with post-procedure discharge instructions, including a section on how to identify potential problems. Should any problems arise concerning this procedure, the patient was given instructions to immediately contact us, at any time, without hesitation. In any case, we plan to contact the patient by telephone for a follow-up status report regarding this interventional procedure.  Comments:  No additional relevant information.  Plan of Care   Imaging Orders  No imaging studies ordered today    Procedure Orders     KNEE INJECTION  Medications ordered for procedure: Meds ordered this encounter  Medications  . lidocaine (PF) (XYLOCAINE) 1 % injection 5 mL  . ropivacaine (PF) 2 mg/mL (0.2%) (NAROPIN) injection 2 mL  . methylPREDNISolone acetate (DEPO-MEDROL) injection 80 mg  . Diclofenac Sodium 1 % CREA    Sig: Place 4 g onto the skin 4 (four) times daily as  needed (Apply a 4g (4.5-in) line to each knee.).    Dispense:  100 g    Refill:  5   Medications administered: We administered ropivacaine (PF) 2 mg/mL (0.2%) and methylPREDNISolone acetate.  See the medical record for exact dosing, route, and time of administration.  New Prescriptions   DICLOFENAC SODIUM 1 % CREA    Place 4 g onto the skin 4 (four) times daily as needed (Apply a 4g (4.5-in) line to each knee.).   Disposition: Discharge home  Discharge Date & Time: 07/04/2018; 1426 hrs.   Physician-requested Follow-up: Return if symptoms worsen or fail to improve.  Future Appointments  Date Time Provider Department Center  07/10/2018  1:40 PM [facility] MR 3 [facility]  07/10/2018  2:20 PM [facility] MR 3 [facility]   Primary Care Physician: System, Pcp Not In Location: [facility] Note by: Oswaldo Done, MD Date: 07/04/2018; Time: 4:17 PM  Disclaimer:  Medicine is not an exact science. The only guarantee in medicine is that nothing is guaranteed. It is important to note that the decision to proceed with this intervention was based on the information collected from the patient. The Data and conclusions were drawn from the patient's questionnaire, the interview, and the physical examination. Because the information was provided in large part by the patient, it cannot be guaranteed that it has not been purposely or unconsciously manipulated. Every effort has been made to obtain as much relevant data as possible for this evaluation. It is important to note that the conclusions that lead to this procedure are derived in large part from the available data. Always take into account that the treatment will also be dependent on availability of resources and existing treatment guidelines, considered by other Pain Management Practitioners as being common knowledge and practice, at the time  of the intervention. For Medico-Legal purposes, it is also  important to point out that variation in procedural techniques and pharmacological choices are the acceptable norm. The indications, contraindications, technique, and results of the above procedure should only be interpreted and judged by a Board-Certified Interventional Pain Specialist with extensive familiarity and expertise in the same exact procedure and technique.

## 2018-07-04 NOTE — Patient Instructions (Signed)

## 2018-07-05 ENCOUNTER — Telehealth: Payer: Self-pay

## 2018-07-05 NOTE — Telephone Encounter (Signed)
No anwer, Di not leave message. Unsure if it was the correct patient.

## 2018-07-10 ENCOUNTER — Other Ambulatory Visit: Payer: Self-pay | Admitting: Ophthalmology

## 2018-07-10 ENCOUNTER — Ambulatory Visit
Admission: RE | Admit: 2018-07-10 | Discharge: 2018-07-10 | Disposition: A | Discharge status: Home / Self Care | Payer: Medicare PPO | Source: Ambulatory Visit | Attending: Ophthalmology | Admitting: Ophthalmology

## 2018-07-10 DIAGNOSIS — H532 Diplopia: Secondary | ICD-10-CM

## 2018-07-12 ENCOUNTER — Ambulatory Visit: Payer: Medicare PPO | Admitting: Pain Medicine

## 2018-07-19 ENCOUNTER — Other Ambulatory Visit: Payer: Self-pay | Admitting: Pain Medicine

## 2018-07-23 ENCOUNTER — Other Ambulatory Visit: Payer: Self-pay | Admitting: Pain Medicine

## 2018-07-23 DIAGNOSIS — M47816 Spondylosis without myelopathy or radiculopathy, lumbar region: Secondary | ICD-10-CM

## 2018-07-23 DIAGNOSIS — M150 Primary generalized (osteo)arthritis: Secondary | ICD-10-CM

## 2018-07-23 DIAGNOSIS — M5442 Lumbago with sciatica, left side: Secondary | ICD-10-CM

## 2018-07-23 DIAGNOSIS — R12 Heartburn: Secondary | ICD-10-CM

## 2018-07-23 DIAGNOSIS — I10 Essential (primary) hypertension: Secondary | ICD-10-CM

## 2018-07-23 DIAGNOSIS — M792 Neuralgia and neuritis, unspecified: Secondary | ICD-10-CM

## 2018-07-23 DIAGNOSIS — E7800 Pure hypercholesterolemia, unspecified: Secondary | ICD-10-CM

## 2018-07-23 DIAGNOSIS — G8929 Other chronic pain: Secondary | ICD-10-CM

## 2018-07-23 DIAGNOSIS — M159 Polyosteoarthritis, unspecified: Secondary | ICD-10-CM

## 2018-07-23 MED ORDER — FAMOTIDINE-CA CARB-MAG HYDROX 10-800-165 MG PO CHEW: 1.0000 | CHEWABLE_TABLET | Freq: Every day | ORAL | 3 refills | Status: DC | PRN

## 2018-07-23 MED ORDER — MELOXICAM 15 MG PO TABS: 15.0000 mg | ORAL_TABLET | Freq: Every day | ORAL | 3 refills | Status: DC

## 2018-07-23 MED ORDER — ROSUVASTATIN CALCIUM 20 MG PO TABS: 20.0000 mg | ORAL_TABLET | Freq: Every day | ORAL | 3 refills | Status: DC

## 2018-07-23 MED ORDER — DICLOFENAC SODIUM 1 % TD CREA: 4.0000 g | TOPICAL_CREAM | Freq: Four times a day (QID) | TRANSDERMAL | 99 refills | Status: AC | PRN

## 2018-07-23 MED ORDER — GABAPENTIN 100 MG PO CAPS: 100.0000 mg | ORAL_CAPSULE | Freq: Every day | ORAL | 3 refills | Status: DC

## 2018-07-23 MED ORDER — GLUCOSAMINE-CHONDROITIN 500-400 MG PO TABS: 1.0000 | ORAL_TABLET | Freq: Every day | ORAL | 3 refills | Status: AC

## 2018-07-23 MED ORDER — TIZANIDINE HCL 2 MG PO TABS: 2.0000 mg | ORAL_TABLET | Freq: Every day | ORAL | 3 refills | Status: DC

## 2018-07-23 MED ORDER — PREDNISONE 20 MG PO TABS: ORAL_TABLET | ORAL | 0 refills | Status: DC

## 2018-07-24 ENCOUNTER — Other Ambulatory Visit: Payer: Self-pay | Admitting: Pain Medicine

## 2018-08-01 ENCOUNTER — Other Ambulatory Visit: Payer: Self-pay | Admitting: Pain Medicine

## 2018-08-01 DIAGNOSIS — I10 Essential (primary) hypertension: Secondary | ICD-10-CM

## 2018-08-01 MED ORDER — CANDESARTAN CILEXETIL 16 MG PO TABS: 16.0000 mg | ORAL_TABLET | Freq: Every day | ORAL | 3 refills | Status: DC

## 2019-02-03 ENCOUNTER — Other Ambulatory Visit: Payer: Self-pay | Admitting: Pain Medicine

## 2019-02-03 DIAGNOSIS — I10 Essential (primary) hypertension: Secondary | ICD-10-CM

## 2019-02-03 DIAGNOSIS — M810 Age-related osteoporosis without current pathological fracture: Secondary | ICD-10-CM

## 2019-02-03 MED ORDER — HYDROCHLOROTHIAZIDE 12.5 MG PO TABS: 12.5000 mg | ORAL_TABLET | ORAL | 11 refills | Status: DC

## 2019-02-03 MED ORDER — ALENDRONATE SODIUM 70 MG PO TABS: 70.0000 mg | ORAL_TABLET | ORAL | 0 refills | Status: DC

## 2019-07-27 ENCOUNTER — Other Ambulatory Visit: Payer: Self-pay

## 2019-07-27 ENCOUNTER — Encounter: Payer: Self-pay | Admitting: Internal Medicine

## 2019-07-27 ENCOUNTER — Ambulatory Visit (INDEPENDENT_AMBULATORY_CARE_PROVIDER_SITE_OTHER): Payer: Medicare PPO | Admitting: Internal Medicine

## 2019-07-27 VITALS — BP 150/90 | HR 67 | Temp 97.3°F | Ht 62.0 in | Wt 147.7 lb

## 2019-07-27 DIAGNOSIS — M25512 Pain in left shoulder: Secondary | ICD-10-CM

## 2019-07-27 DIAGNOSIS — M810 Age-related osteoporosis without current pathological fracture: Secondary | ICD-10-CM

## 2019-07-27 DIAGNOSIS — I10 Essential (primary) hypertension: Secondary | ICD-10-CM

## 2019-07-27 DIAGNOSIS — Z23 Encounter for immunization: Secondary | ICD-10-CM | POA: Diagnosis not present

## 2019-07-27 MED ORDER — PREDNISONE 10 MG (21) PO TBPK: ORAL_TABLET | ORAL | 0 refills | Status: DC

## 2019-07-27 NOTE — Patient Instructions (Addendum)
-  Gusto de verla hoy!  -Prednisona por 6 dias (sigas las instrucciones del medicamento).  Virginia Moore contra el flu hoy.  -Tomese la presion 2-3 veces por semana y traigalos cuando vuelva.  -Vuelva para su fisico en ayunas.

## 2019-07-27 NOTE — Progress Notes (Signed)
New Patient Office Visit     CC/Reason for Visit: Establish care, follow-up chronic conditions, discuss some acute issues Previous PCP: Unknown Last Visit: Unknown  HPI: Virginia Moore is a 83 y.o. female who is coming in today for the above mentioned reasons. Past Medical History is significant for: Hypertension that has been well controlled in the past on hydrochlorothiazide and candesartan.  She lives half of the year here and half of the year in [place], her husband is a patient of mine.  Her blood pressure is elevated in office today but she states that it is usually well controlled.  She has a history of osteoporosis on weekly Fosamax but has not had a DEXA scan since 2015.  She is requesting a flu vaccine today.  She has been having some left shoulder issues for few months.  She used to have a cleaning service at home, however due to the COVID-19 pandemic she has been doing most of the cleaning herself and she believes this has exacerbated her pain.  It especially hurts to lay down at nighttime on her left shoulder.   Past Medical/Surgical History: Past Medical History:  Diagnosis Date  . Hiatal hernia   . Hypertension   . Osteoporosis     Past Surgical History:  Procedure Laterality Date  . ABDOMINAL HYSTERECTOMY      Social History:  reports that she has never smoked. She has never used smokeless tobacco. She reports previous alcohol use. She reports that she does not use drugs.  Allergies: Allergies  Allergen Reactions  . Garlic Diarrhea and Nausea And Vomiting    Family History:  Family History  Problem Relation Age of Onset  . Cancer Mother   . Cancer Father      Current Outpatient Medications:  .  alendronate (FOSAMAX) 70 MG tablet, Take 1 tablet (70 mg total) by mouth once a week for 52 doses., Disp: 52 tablet, Rfl: 0 .  aspirin 81 MG chewable tablet, Chew 1 tablet (81 mg total) by mouth daily., Disp: 90 tablet, Rfl: 0 .  candesartan (ATACAND)  16 MG tablet, Take 1 tablet (16 mg total) by mouth daily., Disp: 90 tablet, Rfl: 3 .  Cholecalciferol (VITAMIN D3) 75 MCG (3000 UT) TABS, Take by mouth., Disp: , Rfl:  .  Diclofenac Sodium 1 % CREA, Place 4 g onto the skin 4 (four) times daily as needed (Apply a 4g (4.5-in) line to each knee.)., Disp: 100 g, Rfl: PRN .  famotidine-calcium carbonate-magnesium hydroxide (PEPCID COMPLETE) 10-800-165 MG chewable tablet, Chew 1 tablet by mouth daily as needed., Disp: 90 tablet, Rfl: 3 .  glucosamine-chondroitin 500-400 MG tablet, Take 1 tablet by mouth daily., Disp: 90 tablet, Rfl: 3 .  hydrochlorothiazide (HYDRODIURIL) 12.5 MG tablet, Take 1 tablet (12.5 mg total) by mouth every other day., Disp: 15 tablet, Rfl: 11 .  meloxicam (MOBIC) 15 MG tablet, Take 1 tablet (15 mg total) by mouth daily., Disp: 90 tablet, Rfl: 3 .  Multiple Vitamin (MULTIVITAMIN) capsule, Take 1 capsule by mouth daily., Disp: 90 capsule, Rfl: 0 .  rosuvastatin (CRESTOR) 20 MG tablet, Take 1 tablet (20 mg total) by mouth at bedtime., Disp: 90 tablet, Rfl: 3 .  tiZANidine (ZANAFLEX) 2 MG tablet, Take 1 tablet (2 mg total) by mouth at bedtime., Disp: 90 tablet, Rfl: 3 .  vitamin C (ASCORBIC ACID) 500 MG tablet, Take 1 tablet (500 mg total) by mouth daily., Disp: 90 tablet, Rfl: 0 .  predniSONE (STERAPRED UNI-PAK 21 TAB) 10 MG (21) TBPK tablet, Take as directed, Disp: 21 tablet, Rfl: 0  Review of Systems:  Constitutional: Denies fever, chills, diaphoresis, appetite change and fatigue.  HEENT: Denies photophobia, eye pain, redness, hearing loss, ear pain, congestion, sore throat, rhinorrhea, sneezing, mouth sores, trouble swallowing, neck pain, neck stiffness and tinnitus.   Respiratory: Denies SOB, DOE, cough, chest tightness,  and wheezing.   Cardiovascular: Denies chest pain, palpitations and leg swelling.  Gastrointestinal: Denies nausea, vomiting, abdominal pain, diarrhea, constipation, blood in stool and abdominal distention.   Genitourinary: Denies dysuria, urgency, frequency, hematuria, flank pain and difficulty urinating.  Endocrine: Denies: hot or cold intolerance, sweats, changes in hair or nails, polyuria, polydipsia. Musculoskeletal: Denies myalgias, back pain, joint swelling, gait problem.  Skin: Denies pallor, rash and wound.  Neurological: Denies dizziness, seizures, syncope, weakness, light-headedness, numbness and headaches.  Hematological: Denies adenopathy. Easy bruising, personal or family bleeding history  Psychiatric/Behavioral: Denies suicidal ideation, mood changes, confusion, nervousness, sleep disturbance and agitation    Physical Exam: Vitals:   07/27/19 1135  BP: (!) 150/90  Pulse: 67  Temp: (!) 97.3 F (36.3 C)  TempSrc: Temporal  SpO2: 97%  Weight: 147 lb 11.2 oz (67 kg)  Height: 5\' 2"  (1.575 m)   Body mass index is 27.01 kg/m.  Constitutional: NAD, calm, comfortable, she appears much younger than her stated age. Eyes: PERRL, lids and conjunctivae normal, wears corrective lenses ENMT: Mucous membranes are moist. Respiratory: clear to auscultation bilaterally, no wheezing, no crackles. Normal respiratory effort. No accessory muscle use.  Cardiovascular: Regular rate and rhythm, no murmurs / rubs / gallops. No extremity edema. 2+ pedal pulses. No carotid bruits.  Musculoskeletal: no clubbing / cyanosis. No joint deformity upper and lower extremities. Good ROM, no contractures. Normal muscle tone.  She has pain to palpation of her left shoulder, full range of motion with moderate pain to extreme ranges of motion. Skin: no rashes, lesions, ulcers. No induration Neurologic: Grossly intact and nonfocal Psychiatric: Normal judgment and insight. Alert and oriented x 3. Normal mood.    Impression and Plan:  Acute pain of left shoulder -Probably has some bursitis. -Advised icing, she has elected to try an oral prednisone taper.  She has been advised that cortisone injection is also a  possibility. -We will defer imaging for now but can consider if continued pain.  Essential hypertension -Seems uncontrolled at today's visit. -She will do ambulatory blood pressure monitoring and bring her log into next visit to determine if we need to escalate her antihypertensive therapy.  Osteoporosis without current pathological fracture, unspecified osteoporosis type -Continue Fosamax, needs repeat DEXA when she returns for CPE.    Patient Instructions  -Gusto de verla hoy!  -Prednisona por 6 dias (sigas las instrucciones del medicamento).  contra el flu hoy.  -Tomese la presion 2-3 veces por semana y traigalos cuando vuelva.  -Vuelva para su fisico en ayunas.     Madilyn Fireman, MD [facility]

## 2019-08-10 ENCOUNTER — Ambulatory Visit: Payer: Self-pay | Admitting: *Deleted

## 2019-08-10 NOTE — Telephone Encounter (Signed)
I returned call to pt.   She was asked by Dr. Paula Libra to monitor her BP because it was 150/90 when in the office on 07/27/2019.    Dr. Jerilee Hoh wanted to rule out "white coat syndrome" per pt.   This morning her BP is 149/93.   She denies any symptoms such as headache, blurred vision, etc. "I have an appt with Dr. Jerilee Hoh on Wed. Should I wait and see her then?"  I let her know since she was not having symptoms that it was ok until Wednesday's appt (protocol is to be seen within 2 weeks).    Her appt is 08/15/2019 at 11:00.  She is taking her Candesartan 60 mg and HCTZ 12.5 mg.   Not missed any doses.    She asked if she should take more of the BP medication.   I instructed her not to take more or change the amount she is taking until she sees Dr. Jerilee Hoh on Wednesday.    She verbalized understanding and was agreeable and comfortable with waiting until Wednesday's appt.   Reason for Disposition . [6] Systolic BP  >= 962 OR Diastolic >= 80 AND [9] taking BP medications  Answer Assessment - Initial Assessment Questions 1. BLOOD PRESSURE: "What is the blood pressure?" "Did you take at least two measurements 5 minutes apart?"     149/93 this morning.   My BP was 150/90 in the office.    It's been 145/83, 140/87 at night.    Dr. Jerilee Hoh asked me to monitor my BP.   2. ONSET: "When did you take your blood pressure?"     149/90 this morning.     3. HOW: "How did you obtain the blood pressure?" (e.g., visiting nurse, automatic home BP monitor)     Automatic BP machine.  4. HISTORY: "Do you have a history of high blood pressure?"     Yes 5. MEDICATIONS: "Are you taking any medications for blood pressure?" "Have you missed any doses recently?"     Yes Candesartan 60 mg  and HCTZ 12.5 mg  6. OTHER SYMPTOMS: "Do you have any symptoms?" (e.g., headache, chest pain, blurred vision, difficulty breathing, weakness)     No 7. PREGNANCY: "Is there any chance you are pregnant?" "When was your last  menstrual period?"     N/A due to age  Dr. Jerilee Hoh was going to  Send in a steroid for my left shoulder pain.  Protocols used: HIGH BLOOD PRESSURE-A-AH

## 2019-08-13 ENCOUNTER — Other Ambulatory Visit: Payer: Self-pay | Admitting: Pain Medicine

## 2019-08-13 DIAGNOSIS — E7800 Pure hypercholesterolemia, unspecified: Secondary | ICD-10-CM

## 2019-08-15 ENCOUNTER — Other Ambulatory Visit: Payer: Self-pay

## 2019-08-15 ENCOUNTER — Ambulatory Visit (INDEPENDENT_AMBULATORY_CARE_PROVIDER_SITE_OTHER): Payer: Medicare PPO | Admitting: Internal Medicine

## 2019-08-15 ENCOUNTER — Encounter: Payer: Self-pay | Admitting: Internal Medicine

## 2019-08-15 VITALS — BP 148/90 | HR 72 | Temp 97.9°F | Ht 61.0 in | Wt 144.6 lb

## 2019-08-15 DIAGNOSIS — Z0000 Encounter for general adult medical examination without abnormal findings: Secondary | ICD-10-CM

## 2019-08-15 DIAGNOSIS — E559 Vitamin D deficiency, unspecified: Secondary | ICD-10-CM | POA: Diagnosis not present

## 2019-08-15 DIAGNOSIS — H6122 Impacted cerumen, left ear: Secondary | ICD-10-CM | POA: Diagnosis not present

## 2019-08-15 DIAGNOSIS — Z13820 Encounter for screening for osteoporosis: Secondary | ICD-10-CM

## 2019-08-15 DIAGNOSIS — M810 Age-related osteoporosis without current pathological fracture: Secondary | ICD-10-CM

## 2019-08-15 DIAGNOSIS — I10 Essential (primary) hypertension: Secondary | ICD-10-CM | POA: Diagnosis not present

## 2019-08-15 DIAGNOSIS — Z1239 Encounter for other screening for malignant neoplasm of breast: Secondary | ICD-10-CM

## 2019-08-15 LAB — CBC WITH DIFFERENTIAL/PLATELET
Basophils Absolute: 0 10*3/uL (ref 0.0–0.1)
Basophils Relative: 0.8 % (ref 0.0–3.0)
Eosinophils Absolute: 0.2 10*3/uL (ref 0.0–0.7)
Eosinophils Relative: 2.4 % (ref 0.0–5.0)
HCT: 36.8 % (ref 36.0–46.0)
Hemoglobin: 12.8 g/dL (ref 12.0–15.0)
Lymphocytes Relative: 18.8 % (ref 12.0–46.0)
Lymphs Abs: 1.2 10*3/uL (ref 0.7–4.0)
MCHC: 34.8 g/dL (ref 30.0–36.0)
MCV: 92.3 fl (ref 78.0–100.0)
Monocytes Absolute: 0.5 10*3/uL (ref 0.1–1.0)
Monocytes Relative: 7.9 % (ref 3.0–12.0)
Neutro Abs: 4.6 10*3/uL (ref 1.4–7.7)
Neutrophils Relative %: 70.1 % (ref 43.0–77.0)
Platelets: 236 10*3/uL (ref 150.0–400.0)
RBC: 3.98 Mil/uL (ref 3.87–5.11)
RDW: 12.2 % (ref 11.5–15.5)
WBC: 6.5 10*3/uL (ref 4.0–10.5)

## 2019-08-15 LAB — COMPREHENSIVE METABOLIC PANEL
ALT: 23 U/L (ref 0–35)
AST: 28 U/L (ref 0–37)
Albumin: 4.6 g/dL (ref 3.5–5.2)
Alkaline Phosphatase: 54 U/L (ref 39–117)
BUN: 13 mg/dL (ref 6–23)
CO2: 29 mEq/L (ref 19–32)
Calcium: 9.7 mg/dL (ref 8.4–10.5)
Chloride: 100 mEq/L (ref 96–112)
Creatinine, Ser: 0.6 mg/dL (ref 0.40–1.20)
GFR: 95.82 mL/min (ref >60.00)
Glucose, Bld: 93 mg/dL (ref 70–99)
Potassium: 4.3 mEq/L (ref 3.5–5.1)
Sodium: 138 mEq/L (ref 135–145)
Total Bilirubin: 1.4 mg/dL — ABNORMAL HIGH (ref 0.2–1.2)
Total Protein: 6.9 g/dL (ref 6.0–8.3)

## 2019-08-15 LAB — VITAMIN D 25 HYDROXY (VIT D DEFICIENCY, FRACTURES): VITD: 34.21 ng/mL (ref 30.00–100.00)

## 2019-08-15 LAB — LIPID PANEL
Cholesterol: 156 mg/dL (ref 0–200)
HDL: 69.2 mg/dL (ref >39.00)
LDL Cholesterol: 73 mg/dL (ref 0–99)
NonHDL: 87
Total CHOL/HDL Ratio: 2
Triglycerides: 69 mg/dL (ref 0.0–149.0)
VLDL: 13.8 mg/dL (ref 0.0–40.0)

## 2019-08-15 LAB — VITAMIN B12: Vitamin B-12: 674 pg/mL (ref 211–911)

## 2019-08-15 LAB — HEMOGLOBIN A1C: Hgb A1c MFr Bld: 5.8 % (ref 4.6–6.5)

## 2019-08-15 LAB — TSH: TSH: 1.27 u[IU]/mL (ref 0.35–4.50)

## 2019-08-15 NOTE — Progress Notes (Signed)
Established Patient Office Visit     CC/Reason for Visit: Annual preventive exam and subsequent Medicare wellness visit  HPI: Virginia Moore is a 83 y.o. female who is coming in today for the above mentioned reasons. Past Medical History is significant for: Hypertension that has been well controlled in the past on hydrochlorothiazide and candesartan.  She has a lot of her medical care in [place] she lives half of this year there.  We have noticed her blood pressures to be elevated on 2 past visits, blood pressure at home has been in the 140s over 90s as well.  She recently got her flu shot in the office, she has routine eye and dental care.  She would like to continue with yearly mammograms.  She is due for repeat bone density test.  She gets her colonoscopies with her GI physician in [place].  She has not perceived any hearing difficulties she does all of her housework and gardening.   Past Medical/Surgical History: Past Medical History:  Diagnosis Date  . Hiatal hernia   . Hypertension   . Osteoporosis     Past Surgical History:  Procedure Laterality Date  . ABDOMINAL HYSTERECTOMY      Social History:  reports that she has never smoked. She has never used smokeless tobacco. She reports previous alcohol use. She reports that she does not use drugs.  Allergies: Allergies  Allergen Reactions  . Garlic Diarrhea and Nausea And Vomiting    Family History:  Family History  Problem Relation Age of Onset  . Cancer Mother   . Cancer Father      Current Outpatient Medications:  .  alendronate (FOSAMAX) 70 MG tablet, Take 1 tablet (70 mg total) by mouth once a week for 52 doses., Disp: 52 tablet, Rfl: 0 .  aspirin 81 MG chewable tablet, Chew 1 tablet (81 mg total) by mouth daily., Disp: 90 tablet, Rfl: 0 .  candesartan (ATACAND) 16 MG tablet, Take 1 tablet (16 mg total) by mouth daily., Disp: 90 tablet, Rfl: 3 .  Cholecalciferol (VITAMIN D3) 75 MCG (3000 UT)  TABS, Take by mouth., Disp: , Rfl:  .  Diclofenac Sodium 1 % CREA, Place 4 g onto the skin 4 (four) times daily as needed (Apply a 4g (4.5-in) line to each knee.)., Disp: 100 g, Rfl: PRN .  famotidine-calcium carbonate-magnesium hydroxide (PEPCID COMPLETE) 10-800-165 MG chewable tablet, Chew 1 tablet by mouth daily as needed., Disp: 90 tablet, Rfl: 3 .  glucosamine-chondroitin 500-400 MG tablet, Take 1 tablet by mouth daily., Disp: 90 tablet, Rfl: 3 .  hydrochlorothiazide (HYDRODIURIL) 12.5 MG tablet, Take 1 tablet (12.5 mg total) by mouth every other day., Disp: 15 tablet, Rfl: 11 .  meloxicam (MOBIC) 15 MG tablet, Take 1 tablet (15 mg total) by mouth daily., Disp: 90 tablet, Rfl: 3 .  Multiple Vitamin (MULTIVITAMIN) capsule, Take 1 capsule by mouth daily., Disp: 90 capsule, Rfl: 0 .  rosuvastatin (CRESTOR) 20 MG tablet, Take 1 tablet (20 mg total) by mouth at bedtime., Disp: 90 tablet, Rfl: 3 .  tiZANidine (ZANAFLEX) 2 MG tablet, Take 1 tablet (2 mg total) by mouth at bedtime., Disp: 90 tablet, Rfl: 3 .  vitamin C (ASCORBIC ACID) 500 MG tablet, Take 1 tablet (500 mg total) by mouth daily., Disp: 90 tablet, Rfl: 0  Review of Systems:  Constitutional: Denies fever, chills, diaphoresis, appetite change and fatigue.  HEENT: Denies photophobia, eye pain, redness, hearing loss, ear pain,  congestion, sore throat, rhinorrhea, sneezing, mouth sores, trouble swallowing, neck pain, neck stiffness and tinnitus.   Respiratory: Denies SOB, DOE, cough, chest tightness,  and wheezing.   Cardiovascular: Denies chest pain, palpitations and leg swelling.  Gastrointestinal: Denies nausea, vomiting, abdominal pain, diarrhea, constipation, blood in stool and abdominal distention.  Genitourinary: Denies dysuria, urgency, frequency, hematuria, flank pain and difficulty urinating.  Endocrine: Denies: hot or cold intolerance, sweats, changes in hair or nails, polyuria, polydipsia. Musculoskeletal: Denies myalgias, back  pain, joint swelling, arthralgias and gait problem.  Skin: Denies pallor, rash and wound.  Neurological: Denies dizziness, seizures, syncope, weakness, light-headedness, numbness and headaches.  Hematological: Denies adenopathy. Easy bruising, personal or family bleeding history  Psychiatric/Behavioral: Denies suicidal ideation, mood changes, confusion, nervousness, sleep disturbance and agitation    Physical Exam: Vitals:   08/15/19 1106  BP: (!) 148/90  Pulse: 72  Temp: 97.9 F (36.6 C)  TempSrc: Temporal  SpO2: 97%  Weight: 144 lb 9.6 oz (65.6 kg)  Height: '5\' 1"'$  (1.549 m)    Body mass index is 27.32 kg/m.   Constitutional: NAD, calm, comfortable Eyes: PERRL, lids and conjunctivae normal, wears corrective lenses ENMT: Mucous membranes are moist. Posterior pharynx clear of any exudate or lesions. Normal dentition. Tympanic membrane is pearly white, no erythema or bulging on the right, left is obstructed by cerumen Neck: normal, supple, no masses, no thyromegaly Respiratory: clear to auscultation bilaterally, no wheezing, no crackles. Normal respiratory effort. No accessory muscle use.  Cardiovascular: Regular rate and rhythm, no murmurs / rubs / gallops. No extremity edema. 2+ pedal pulses. Abdomen: no tenderness, no masses palpated. No hepatosplenomegaly. Bowel sounds positive.  Musculoskeletal: no clubbing / cyanosis. No joint deformity upper and lower extremities. Good ROM, no contractures. Normal muscle tone.  Skin: no rashes, lesions, ulcers. No induration Neurologic: CN 2-12 grossly intact. Sensation intact, DTR normal. Strength 5/5 in all 4.  Psychiatric: Normal judgment and insight. Alert and oriented x 3. Normal mood.   Subsequent Medicare wellness visit   1. Risk factors, based on past  M,S,F -cardiovascular disease risk factors include age, history of hypertension   2.  Physical activities: Housework and yard work   3.  Depression/mood:  Stable, not depressed    4.  Hearing:  No issues   5.  ADL's: Independent in all ADLs   6.  Fall risk:  Low fall risk   7.  Home safety: No problems identified   8.  Height weight, and visual acuity: Height and weight as above, visual acuity is 20/25 with each eye independently and together   9.  Counseling:  Low-sodium diet   10. Lab orders based on risk factors: Laboratory update will be reviewed   11. Referral :  None today   12. Care plan:  Follow-up 3 months for hypertension   13. Cognitive assessment:  No cognitive impairment   14. Screening: Patient provided with a written and personalized 5-10 year screening schedule in the AVS.   yes   15. Provider List Update:   PCP only in the [place], rest of her medical team is in [place]  16. Advance Directives: Full code     Office Visit from 08/15/2019 in [place] at [facility]  PHQ-9 Total Score  0      Fall Risk  08/15/2019 07/27/2019 07/04/2018 11/23/2016  Falls in the past year? 0 0 No No  Number falls in past yr: 0 0 - -  Injury with Fall? 0  0 - -     Impression and Plan:  Encounter for preventive health examination  -She has routine eye and dental care. -Due for tetanus and shingles vaccination series which she will obtain at her pharmacy, otherwise immunizations are up-to-date. -Screening labs to be done today. -Healthy lifestyle has been discussed in detail. -She prefers to continue colonoscopies with her physician in [place]. -She would like to continue mammograms for breast cancer screening, will order. -DEXA scan will be requested for osteoporosis follow-up, she is taking alendronate.  Hearing loss of left ear due to cerumen impaction -Cerumen Desimpaction  Warm water was applied and gentle ear lavage performed on left ear. There were no complications and following the desimpaction the tympanic membranes were visible. Tympanic membranes are intact following the procedure. Auditory canals are normal. The  patient reported relief of symptoms after removal of cerumen.  Osteoporosis without current pathological fracture, unspecified osteoporosis type -Continue alendronate, DEXA scan requested  Vitamin D insufficiency  - Plan: VITAMIN D 25 Hydroxy (Vit-D Deficiency, Fractures)  Essential hypertension -Blood pressure remains elevated. -She is only taking hydrochlorothiazide 3 days a week, have advised that she take it daily, continue ambulatory blood pressure measurements and return in 3 months.     Patient Instructions  -Nice seeing you today!!  -Lab work today; will notify you once results are available.  -Tetanus and shingles vaccines at your pharmacy.  -START taking HCTZ 12.5 mg every day instead of 3 days a week.  -Schedule follow up in 3 months.   Preventive Care 32 Years and Older, Female Preventive care refers to lifestyle choices and visits with your health care provider that can promote health and wellness. This includes:  A yearly physical exam. This is also called an annual well check.  Regular dental and eye exams.  Immunizations.  Screening for certain conditions.  Healthy lifestyle choices, such as diet and exercise. What can I expect for my preventive care visit? Physical exam Your health care provider will check:  Height and weight. These may be used to calculate body mass index (BMI), which is a measurement that tells if you are at a healthy weight.  Heart rate and blood pressure.  Your skin for abnormal spots. Counseling Your health care provider may ask you questions about:  Alcohol, tobacco, and drug use.  Emotional well-being.  Home and relationship well-being.  Sexual activity.  Eating habits.  History of falls.  Memory and ability to understand (cognition).  Work and work Statistician.  Pregnancy and menstrual history. What immunizations do I need?  Influenza (flu) vaccine  This is recommended every year. Tetanus, diphtheria,  and pertussis (Tdap) vaccine  You may need a Td booster every 10 years. Varicella (chickenpox) vaccine  You may need this vaccine if you have not already been vaccinated. Zoster (shingles) vaccine  You may need this after age 60. Pneumococcal conjugate (PCV13) vaccine  One dose is recommended after age 80. Pneumococcal polysaccharide (PPSV23) vaccine  One dose is recommended after age 73. Measles, mumps, and rubella (MMR) vaccine  You may need at least one dose of MMR if you were born in 1957 or later. You may also need a second dose. Meningococcal conjugate (MenACWY) vaccine  You may need this if you have certain conditions. Hepatitis A vaccine  You may need this if you have certain conditions or if you travel or work in places where you may be exposed to hepatitis A. Hepatitis B vaccine  You may need this if  you have certain conditions or if you travel or work in places where you may be exposed to hepatitis B. Haemophilus influenzae type b (Hib) vaccine  You may need this if you have certain conditions. You may receive vaccines as individual doses or as more than one vaccine together in one shot (combination vaccines). Talk with your health care provider about the risks and benefits of combination vaccines. What tests do I need? Blood tests  Lipid and cholesterol levels. These may be checked every 5 years, or more frequently depending on your overall health.  Hepatitis C test.  Hepatitis B test. Screening  Lung cancer screening. You may have this screening every year starting at age 6 if you have a 30-pack-year history of smoking and currently smoke or have quit within the past 15 years.  Colorectal cancer screening. All adults should have this screening starting at age 43 and continuing until age 82. Your health care provider may recommend screening at age 77 if you are at increased risk. You will have tests every 1-10 years, depending on your results and the type of  screening test.  Diabetes screening. This is done by checking your blood sugar (glucose) after you have not eaten for a while (fasting). You may have this done every 1-3 years.  Mammogram. This may be done every 1-2 years. Talk with your health care provider about how often you should have regular mammograms.  BRCA-related cancer screening. This may be done if you have a family history of breast, ovarian, tubal, or peritoneal cancers. Other tests  Sexually transmitted disease (STD) testing.  Bone density scan. This is done to screen for osteoporosis. You may have this done starting at age 3. Follow these instructions at home: Eating and drinking  Eat a diet that includes fresh fruits and vegetables, whole grains, lean protein, and low-fat dairy products. Limit your intake of foods with high amounts of sugar, saturated fats, and salt.  Take vitamin and mineral supplements as recommended by your health care provider.  Do not drink alcohol if your health care provider tells you not to drink.  If you drink alcohol: ? Limit how much you have to 0-1 drink a day. ? Be aware of how much alcohol is in your drink. In the [place] one drink equals one 12 oz bottle of beer (355 mL), one 5 oz glass of wine (148 mL), or one 1 oz glass of hard liquor (44 mL). Lifestyle  Take daily care of your teeth and gums.  Stay active. Exercise for at least 30 minutes on 5 or more days each week.  Do not use any products that contain nicotine or tobacco, such as cigarettes, e-cigarettes, and chewing tobacco. If you need help quitting, ask your health care provider.  If you are sexually active, practice safe sex. Use a condom or other form of protection in order to prevent STIs (sexually transmitted infections).  Talk with your health care provider about taking a low-dose aspirin or statin. What's next?  Go to your health care provider once a year for a well check visit.  Ask your health care provider how  often you should have your eyes and teeth checked.  Stay up to date on all vaccines. This information is not intended to replace advice given to you by your health care provider. Make sure you discuss any questions you have with your health care provider. Document Released: 11/07/2015 Document Revised: 10/05/2018 Document Reviewed: 10/05/2018 Elsevier Patient Education  2020 [employer].  Monroe Qin Hernandez Acosta, MD [facility]   

## 2019-08-15 NOTE — Patient Instructions (Signed)
-Nice seeing you today!!  -Lab work today; will notify you once results are available.  -Tetanus and shingles vaccines at your pharmacy.  -START taking HCTZ 12.5 mg every day instead of 3 days a week.  -Schedule follow up in 3 months.   Preventive Care 37 Years and Older, Female Preventive care refers to lifestyle choices and visits with your health care provider that can promote health and wellness. This includes:  A yearly physical exam. This is also called an annual well check.  Regular dental and eye exams.  Immunizations.  Screening for certain conditions.  Healthy lifestyle choices, such as diet and exercise. What can I expect for my preventive care visit? Physical exam Your health care provider will check:  Height and weight. These may be used to calculate body mass index (BMI), which is a measurement that tells if you are at a healthy weight.  Heart rate and blood pressure.  Your skin for abnormal spots. Counseling Your health care provider may ask you questions about:  Alcohol, tobacco, and drug use.  Emotional well-being.  Home and relationship well-being.  Sexual activity.  Eating habits.  History of falls.  Memory and ability to understand (cognition).  Work and work Statistician.  Pregnancy and menstrual history. What immunizations do I need?  Influenza (flu) vaccine  This is recommended every year. Tetanus, diphtheria, and pertussis (Tdap) vaccine  You may need a Td booster every 10 years. Varicella (chickenpox) vaccine  You may need this vaccine if you have not already been vaccinated. Zoster (shingles) vaccine  You may need this after age 77. Pneumococcal conjugate (PCV13) vaccine  One dose is recommended after age 55. Pneumococcal polysaccharide (PPSV23) vaccine  One dose is recommended after age 12. Measles, mumps, and rubella (MMR) vaccine  You may need at least one dose of MMR if you were born in 1957 or later. You may also  need a second dose. Meningococcal conjugate (MenACWY) vaccine  You may need this if you have certain conditions. Hepatitis A vaccine  You may need this if you have certain conditions or if you travel or work in places where you may be exposed to hepatitis A. Hepatitis B vaccine  You may need this if you have certain conditions or if you travel or work in places where you may be exposed to hepatitis B. Haemophilus influenzae type b (Hib) vaccine  You may need this if you have certain conditions. You may receive vaccines as individual doses or as more than one vaccine together in one shot (combination vaccines). Talk with your health care provider about the risks and benefits of combination vaccines. What tests do I need? Blood tests  Lipid and cholesterol levels. These may be checked every 5 years, or more frequently depending on your overall health.  Hepatitis C test.  Hepatitis B test. Screening  Lung cancer screening. You may have this screening every year starting at age 79 if you have a 30-pack-year history of smoking and currently smoke or have quit within the past 15 years.  Colorectal cancer screening. All adults should have this screening starting at age 61 and continuing until age 63. Your health care provider may recommend screening at age 68 if you are at increased risk. You will have tests every 1-10 years, depending on your results and the type of screening test.  Diabetes screening. This is done by checking your blood sugar (glucose) after you have not eaten for a while (fasting). You may have this done every  1-3 years.  Mammogram. This may be done every 1-2 years. Talk with your health care provider about how often you should have regular mammograms.  BRCA-related cancer screening. This may be done if you have a family history of breast, ovarian, tubal, or peritoneal cancers. Other tests  Sexually transmitted disease (STD) testing.  Bone density scan. This is done  to screen for osteoporosis. You may have this done starting at age 65. Follow these instructions at home: Eating and drinking  Eat a diet that includes fresh fruits and vegetables, whole grains, lean protein, and low-fat dairy products. Limit your intake of foods with high amounts of sugar, saturated fats, and salt.  Take vitamin and mineral supplements as recommended by your health care provider.  Do not drink alcohol if your health care provider tells you not to drink.  If you drink alcohol: ? Limit how much you have to 0-1 drink a day. ? Be aware of how much alcohol is in your drink. In the [place] one drink equals one 12 oz bottle of beer (355 mL), one 5 oz glass of wine (148 mL), or one 1 oz glass of hard liquor (44 mL). Lifestyle  Take daily care of your teeth and gums.  Stay active. Exercise for at least 30 minutes on 5 or more days each week.  Do not use any products that contain nicotine or tobacco, such as cigarettes, e-cigarettes, and chewing tobacco. If you need help quitting, ask your health care provider.  If you are sexually active, practice safe sex. Use a condom or other form of protection in order to prevent STIs (sexually transmitted infections).  Talk with your health care provider about taking a low-dose aspirin or statin. What's next?  Go to your health care provider once a year for a well check visit.  Ask your health care provider how often you should have your eyes and teeth checked.  Stay up to date on all vaccines. This information is not intended to replace advice given to you by your health care provider. Make sure you discuss any questions you have with your health care provider. Document Released: 11/07/2015 Document Revised: 10/05/2018 Document Reviewed: 10/05/2018 Elsevier Patient Education  2020 Elsevier Inc.  

## 2019-08-29 ENCOUNTER — Encounter: Payer: Self-pay | Admitting: Internal Medicine

## 2019-09-03 ENCOUNTER — Telehealth: Payer: Self-pay | Admitting: Internal Medicine

## 2019-09-03 NOTE — Telephone Encounter (Signed)
Copied from [place] [id number]. Topic: General - Other >> Sep 03, 2019  3:50 PM Keene Breath wrote: Reason for CRM: Patient is calling back to get her test results.  Please call from a mobile phone to this number [phone]

## 2019-09-04 NOTE — Telephone Encounter (Signed)
Patient returning call about results.  

## 2019-09-07 NOTE — Telephone Encounter (Signed)
Attempted to call patient, but unable to leave a message. 

## 2019-09-18 ENCOUNTER — Other Ambulatory Visit: Payer: Self-pay | Admitting: Pain Medicine

## 2019-09-18 DIAGNOSIS — R12 Heartburn: Secondary | ICD-10-CM

## 2019-09-18 DIAGNOSIS — M159 Polyosteoarthritis, unspecified: Secondary | ICD-10-CM

## 2019-09-18 DIAGNOSIS — I10 Essential (primary) hypertension: Secondary | ICD-10-CM

## 2019-09-18 DIAGNOSIS — G8929 Other chronic pain: Secondary | ICD-10-CM

## 2019-09-18 DIAGNOSIS — E7800 Pure hypercholesterolemia, unspecified: Secondary | ICD-10-CM

## 2019-09-18 MED ORDER — TIZANIDINE HCL 2 MG PO TABS: 2.0000 mg | ORAL_TABLET | Freq: Every day | ORAL | 3 refills | Status: AC

## 2019-09-18 MED ORDER — MELOXICAM 15 MG PO TABS: 15.0000 mg | ORAL_TABLET | Freq: Every day | ORAL | 3 refills | Status: AC

## 2019-09-18 MED ORDER — CANDESARTAN CILEXETIL 16 MG PO TABS: 16.0000 mg | ORAL_TABLET | Freq: Every day | ORAL | 3 refills | Status: DC

## 2019-09-18 MED ORDER — HYDROCHLOROTHIAZIDE 12.5 MG PO TABS: 12.5000 mg | ORAL_TABLET | ORAL | 11 refills | Status: DC

## 2019-09-18 MED ORDER — PEPCID COMPLETE 10-800-165 MG PO CHEW: 1.0000 | CHEWABLE_TABLET | Freq: Every day | ORAL | 3 refills | Status: AC | PRN

## 2019-09-18 MED ORDER — ROSUVASTATIN CALCIUM 20 MG PO TABS: 20.0000 mg | ORAL_TABLET | Freq: Every day | ORAL | 3 refills | Status: DC

## 2019-11-26 ENCOUNTER — Ambulatory Visit: Payer: Medicare PPO

## 2019-12-02 ENCOUNTER — Ambulatory Visit: Payer: Medicare FFS

## 2019-12-03 ENCOUNTER — Ambulatory Visit: Payer: Medicare FFS | Attending: Internal Medicine

## 2019-12-03 DIAGNOSIS — Z23 Encounter for immunization: Secondary | ICD-10-CM

## 2019-12-03 NOTE — Progress Notes (Signed)
   Covid-19 Vaccination Clinic  Name:  Virginia Moore    MRN: [id number] DOB: December 04, 1935  12/03/2019  Virginia Moore was observed post Covid-19 immunization for 15 minutes without incidence. She was provided with Vaccine Information Sheet and instruction to access the V-Safe system.   Virginia Moore was instructed to call 911 with any severe reactions post vaccine: Marland Kitchen Difficulty breathing  . Swelling of your face and throat  . A fast heartbeat  . A bad rash all over your body  . Dizziness and weakness    Immunizations Administered    Name Date Dose VIS Date Route   Pfizer COVID-19 Vaccine 12/03/2019  1:50 PM 0.3 mL 10/05/2019 Intramuscular   Manufacturer: [employer], [employer]   Lot: [id number]   NDC: [address]-0

## 2019-12-28 ENCOUNTER — Ambulatory Visit: Payer: Medicare FFS | Attending: Internal Medicine

## 2019-12-28 DIAGNOSIS — Z23 Encounter for immunization: Secondary | ICD-10-CM | POA: Insufficient documentation

## 2019-12-28 NOTE — Progress Notes (Signed)
   Covid-19 Vaccination Clinic  Name:  Virginia Moore    MRN: [id number] DOB: 1936-02-21  12/28/2019  [name] was observed post Covid-19 immunization for 15 minutes without incident. She was provided with Vaccine Information Sheet and instruction to access the V-Safe system.   [name] was instructed to call 911 with any severe reactions post vaccine: Marland Kitchen Difficulty breathing  . Swelling of face and throat  . A fast heartbeat  . A bad rash all over body  . Dizziness and weakness   Immunizations Administered    Name Date Dose VIS Date Route   Pfizer COVID-19 Vaccine 12/28/2019  1:24 PM 0.3 mL 10/05/2019 Intramuscular   Manufacturer: [employer], [employer]   Lot: [id number]   NDC: [address]-7

## 2020-01-09 ENCOUNTER — Other Ambulatory Visit: Payer: Self-pay

## 2020-01-09 ENCOUNTER — Ambulatory Visit (INDEPENDENT_AMBULATORY_CARE_PROVIDER_SITE_OTHER): Payer: Medicare FFS | Admitting: Internal Medicine

## 2020-01-09 ENCOUNTER — Encounter: Payer: Self-pay | Admitting: Internal Medicine

## 2020-01-09 VITALS — BP 135/80 | HR 64 | Temp 97.5°F | Wt 144.6 lb

## 2020-01-09 DIAGNOSIS — T50Z95A Adverse effect of other vaccines and biological substances, initial encounter: Secondary | ICD-10-CM

## 2020-01-09 DIAGNOSIS — M810 Age-related osteoporosis without current pathological fracture: Secondary | ICD-10-CM

## 2020-01-09 MED ORDER — ALENDRONATE SODIUM 70 MG PO TABS: 70.0000 mg | ORAL_TABLET | ORAL | 0 refills | Status: DC

## 2020-01-09 NOTE — Progress Notes (Signed)
Acute office Visit     This visit occurred during the SARS-CoV-2 public health emergency.  Safety protocols were in place, including screening questions prior to the visit, additional usage of staff PPE, and extensive cleaning of exam room while observing appropriate contact time as indicated for disinfecting solutions.    CC/Reason for Visit: Vaccine reaction  HPI: Virginia Moore is a 84 y.o. female who is coming in today for the above mentioned reasons.  She had her second Covid vaccine 1 week ago.  After the second vaccine she had 48 hours of headache, severe chills, mild fever, dizziness, body aches.  Is resolved spontaneously.  She is just coming in to make me aware of this.  Past Medical/Surgical History: Past Medical History:  Diagnosis Date  . Hiatal hernia   . Hypertension   . Osteoporosis     Past Surgical History:  Procedure Laterality Date  . ABDOMINAL HYSTERECTOMY      Social History:  reports that she has never smoked. She has never used smokeless tobacco. She reports previous alcohol use. She reports that she does not use drugs.  Allergies: Allergies  Allergen Reactions  . Garlic Diarrhea and Nausea And Vomiting    Family History:  Family History  Problem Relation Age of Onset  . Cancer Mother   . Cancer Father      Current Outpatient Medications:  .  alendronate (FOSAMAX) 70 MG tablet, Take 1 tablet (70 mg total) by mouth once a week for 52 doses., Disp: 52 tablet, Rfl: 0 .  aspirin 81 MG chewable tablet, Chew 1 tablet (81 mg total) by mouth daily., Disp: 90 tablet, Rfl: 0 .  candesartan (ATACAND) 16 MG tablet, Take 1 tablet (16 mg total) by mouth daily., Disp: 90 tablet, Rfl: 3 .  Cholecalciferol (VITAMIN D3) 75 MCG (3000 UT) TABS, Take by mouth., Disp: , Rfl:  .  famotidine-calcium carbonate-magnesium hydroxide (PEPCID COMPLETE) 10-800-165 MG chewable tablet, Chew 1 tablet by mouth daily as needed., Disp: 90 tablet, Rfl: 3 .   hydrochlorothiazide (HYDRODIURIL) 12.5 MG tablet, Take 1 tablet (12.5 mg total) by mouth every other day., Disp: 15 tablet, Rfl: 11 .  meloxicam (MOBIC) 15 MG tablet, Take 1 tablet (15 mg total) by mouth daily., Disp: 90 tablet, Rfl: 3 .  rosuvastatin (CRESTOR) 20 MG tablet, Take 1 tablet (20 mg total) by mouth at bedtime., Disp: 90 tablet, Rfl: 3 .  tiZANidine (ZANAFLEX) 2 MG tablet, Take 1 tablet (2 mg total) by mouth at bedtime., Disp: 90 tablet, Rfl: 3 .  Diclofenac Sodium 1 % CREA, Place 4 g onto the skin 4 (four) times daily as needed (Apply a 4g (4.5-in) line to each knee.)., Disp: 100 g, Rfl: PRN .  glucosamine-chondroitin 500-400 MG tablet, Take 1 tablet by mouth daily., Disp: 90 tablet, Rfl: 3 .  Multiple Vitamin (MULTIVITAMIN) capsule, Take 1 capsule by mouth daily., Disp: 90 capsule, Rfl: 0 .  vitamin C (ASCORBIC ACID) 500 MG tablet, Take 1 tablet (500 mg total) by mouth daily., Disp: 90 tablet, Rfl: 0  Review of Systems:  Constitutional: Denies fever, chills, diaphoresis, appetite change and fatigue.  HEENT: Denies photophobia, eye pain, redness, hearing loss, ear pain, congestion, sore throat, rhinorrhea, sneezing, mouth sores, trouble swallowing, neck pain, neck stiffness and tinnitus.   Respiratory: Denies SOB, DOE, cough, chest tightness,  and wheezing.   Cardiovascular: Denies chest pain, palpitations and leg swelling.  Gastrointestinal: Denies nausea, vomiting, abdominal pain, diarrhea, constipation,  blood in stool and abdominal distention.  Genitourinary: Denies dysuria, urgency, frequency, hematuria, flank pain and difficulty urinating.  Endocrine: Denies: hot or cold intolerance, sweats, changes in hair or nails, polyuria, polydipsia. Musculoskeletal: Denies myalgias, back pain, joint swelling, arthralgias and gait problem.  Skin: Denies pallor, rash and wound.  Neurological: Denies dizziness, seizures, syncope, weakness, light-headedness, numbness and headaches.    Hematological: Denies adenopathy. Easy bruising, personal or family bleeding history  Psychiatric/Behavioral: Denies suicidal ideation, mood changes, confusion, nervousness, sleep disturbance and agitation    Physical Exam: Vitals:   01/09/20 1104  BP: 135/80  Pulse: 64  Temp: (!) 97.5 F (36.4 C)  TempSrc: Temporal  SpO2: 98%  Weight: 144 lb 9.6 oz (65.6 kg)    Body mass index is 27.32 kg/m.   Constitutional: NAD, calm, comfortable Eyes: PERRL, lids and conjunctivae normal, wears corrective lenses  Psychiatric: Normal judgment and insight. Alert and oriented x 3. Normal mood.    Impression and Plan:  Adverse effect of vaccine, initial encounter -Expected response to second Covid vaccine.  Osteoporosis, senile  - Plan: alendronate (FOSAMAX) 70 MG tablet       Carliyah Cotterman Philip Aspen, MD [facility]

## 2020-01-10 ENCOUNTER — Other Ambulatory Visit: Payer: Self-pay | Admitting: Pain Medicine

## 2020-01-10 DIAGNOSIS — M810 Age-related osteoporosis without current pathological fracture: Secondary | ICD-10-CM

## 2020-03-19 ENCOUNTER — Telehealth: Payer: Self-pay | Admitting: Internal Medicine

## 2020-03-19 DIAGNOSIS — E7800 Pure hypercholesterolemia, unspecified: Secondary | ICD-10-CM

## 2020-03-19 DIAGNOSIS — I10 Essential (primary) hypertension: Secondary | ICD-10-CM

## 2020-03-19 DIAGNOSIS — M810 Age-related osteoporosis without current pathological fracture: Secondary | ICD-10-CM

## 2020-03-19 NOTE — Telephone Encounter (Signed)
Pt is requesting a refill on Candesartan 16 mg for 90 days, Alendronate 70 mg tablet, Rosuvastatin 20 mg tablet, and Hydrochlorothiazide 12.5 mg tablet. Pt uses [facility] in [place]. Thanks

## 2020-03-20 MED ORDER — ALENDRONATE SODIUM 70 MG PO TABS: 70.0000 mg | ORAL_TABLET | ORAL | 0 refills | Status: AC

## 2020-03-20 MED ORDER — CANDESARTAN CILEXETIL 16 MG PO TABS: 16.0000 mg | ORAL_TABLET | Freq: Every day | ORAL | 1 refills | Status: AC

## 2020-03-20 MED ORDER — HYDROCHLOROTHIAZIDE 12.5 MG PO TABS: 12.5000 mg | ORAL_TABLET | ORAL | 2 refills | Status: DC

## 2020-03-20 MED ORDER — ROSUVASTATIN CALCIUM 20 MG PO TABS: 20.0000 mg | ORAL_TABLET | Freq: Every day | ORAL | 1 refills | Status: AC

## 2020-03-20 NOTE — Telephone Encounter (Signed)
Refills sent

## 2020-03-20 NOTE — Addendum Note (Signed)
Addended by: Kern Reap B on: 03/20/2020 05:02 PM   Modules accepted: Orders

## 2020-04-05 IMAGING — MR MR ORBITS W/O CM
13 of 15 series · 35 of 48 positions shown · non-contrast
Comparison: MRI brain same day

CLINICAL DATA: Double vision.  Patient refused contrast.

EXAM:
MRI OF THE ORBITS WITHOUT CONTRAST
TECHNIQUE: Multiplanar, multisequence MR imaging of the orbits was performed.
No intravenous contrast was administered.

[Series 6: T1 · sagittal · 4.0mm · 0.72mm/px · 1 of 30 slices shown (1 of 4)]
[im 1/30]
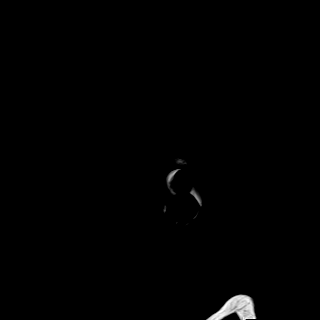

[Series 7: DWI · axial · 3.0mm · 1.44mm/px · z∈[-54,+101]mm · 4 of 88 slices shown (1 of 4)]
[im 1/88]
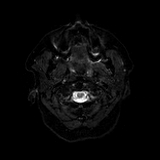
[im 30/88]
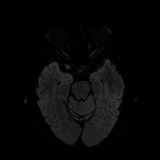
[im 59/88]
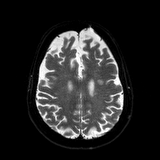
[im 88/88]
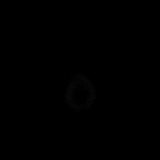

[Series 8: DWI · axial · 3.0mm · 1.44mm/px · z∈[-54,+101]mm · 3 of 44 slices shown (2 of 4)]
[im 1/44]
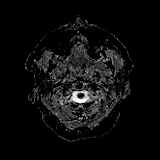
[im 22/44]
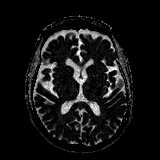
[im 44/44]
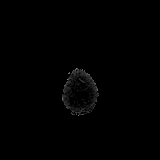

[Series 9: DWI · coronal · 5.0mm · 1.44mm/px · 4 of 66 slices shown (3 of 4)]
[im 1/66]
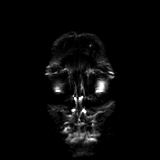
[im 22/66]
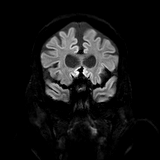
[im 44/66]
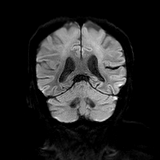
[im 66/66]
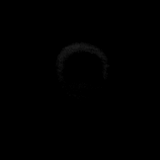

[Series 10: DWI · coronal · 5.0mm · 1.44mm/px · 2 of 33 slices shown (4 of 4)]
[im 1/33]
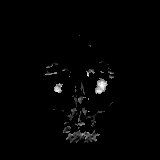
[im 33/33]
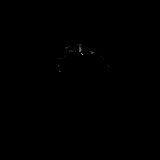

[Series 11: T2 · coronal · 4.5mm · 0.36mm/px · 2 of 32 slices shown (1 of 2)]
[im 1/32]
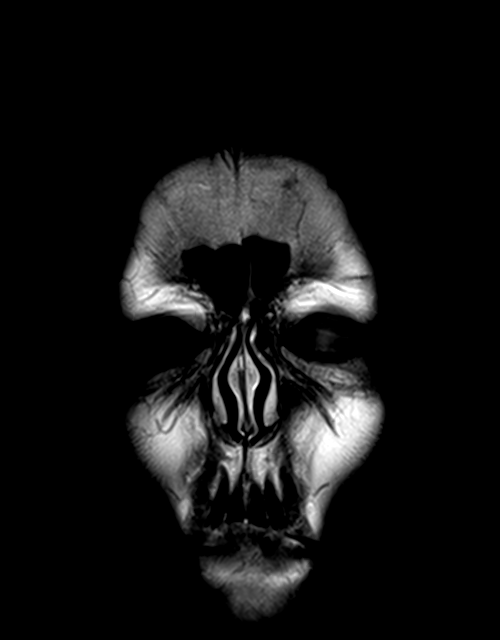
[im 32/32]
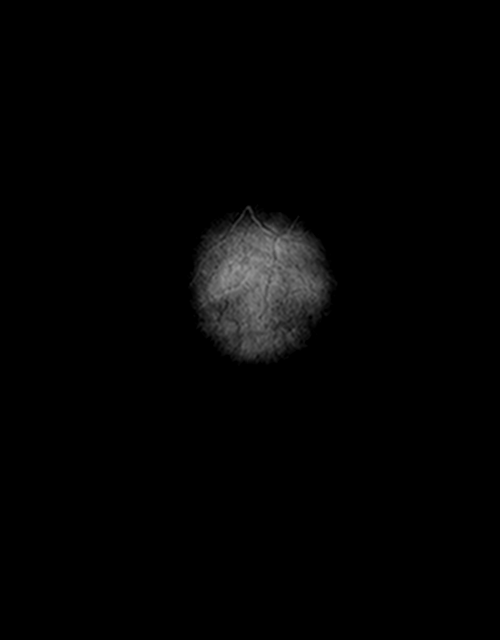

[Series 12: T2 · axial · 4.0mm · 0.36mm/px · z∈[-56,+100]mm · 2 of 31 slices shown (2 of 2)]
[im 1/31]
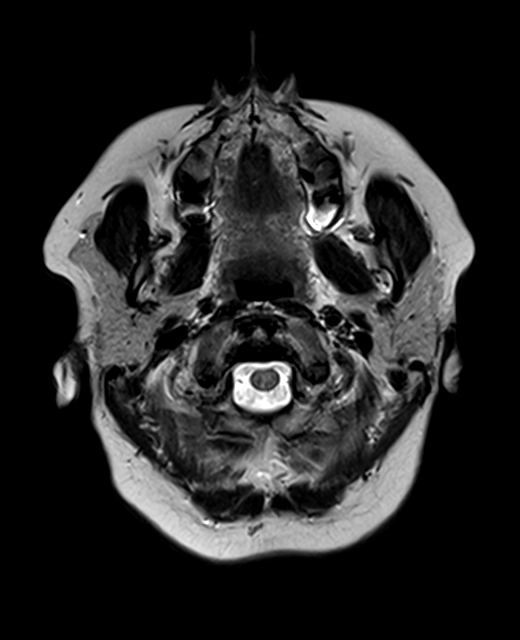
[im 31/31]
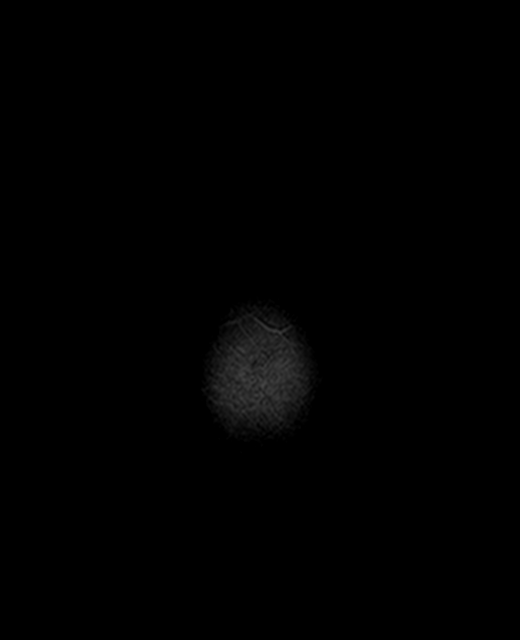

[Series 13: FLAIR · axial · 3.0mm · 0.72mm/px · z∈[-56,+100]mm · 2 of 27 slices shown]
[im 1/27]
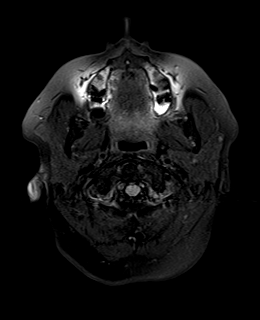
[im 27/27]
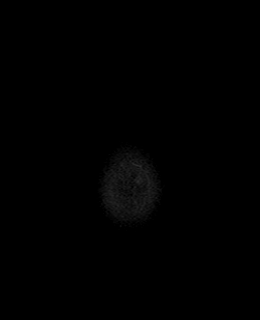

[Series 16: T1 · axial · 1.0mm · 0.90mm/px · z∈[-58,+101]mm · 8 of 160 slices shown (2 of 4)]
[im 1/160]
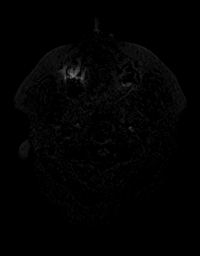
[im 20/160]
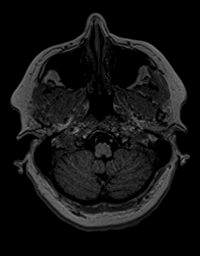
[im 40/160]
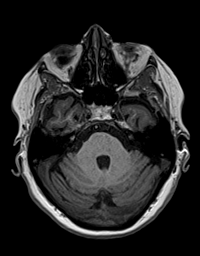
[im 60/160]
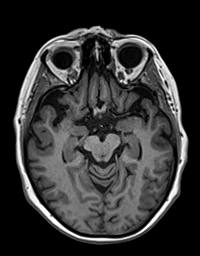
[im 100/160]
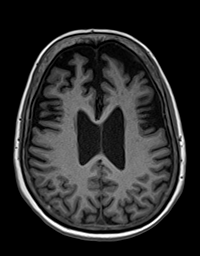
[im 120/160]
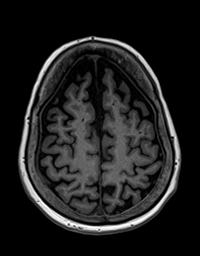
[im 140/160]
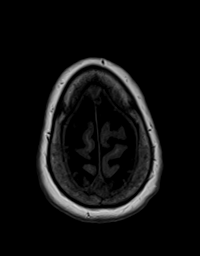
[im 160/160]
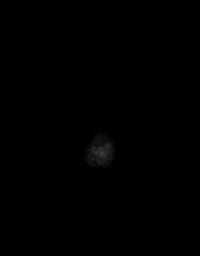

[Series 17: T2 fat-sat · axial · 2.5mm · 0.25mm/px · z∈[-40,+34]mm · 2 of 28 slices shown (1 of 2)]
[im 1/28]
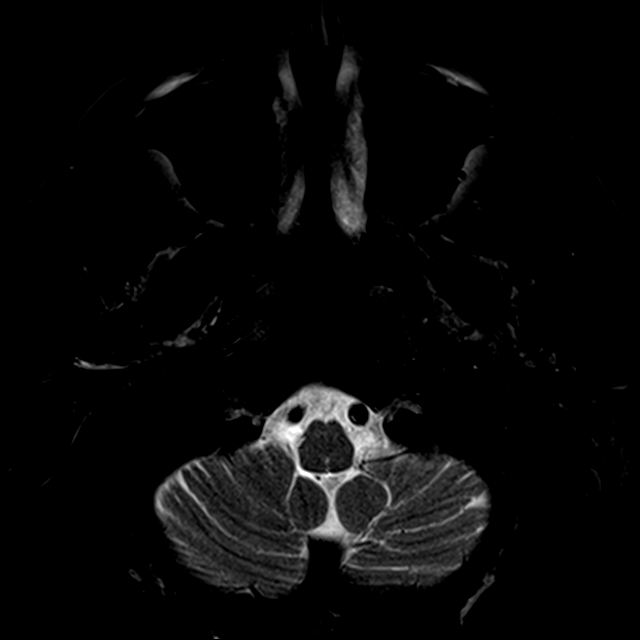
[im 28/28]
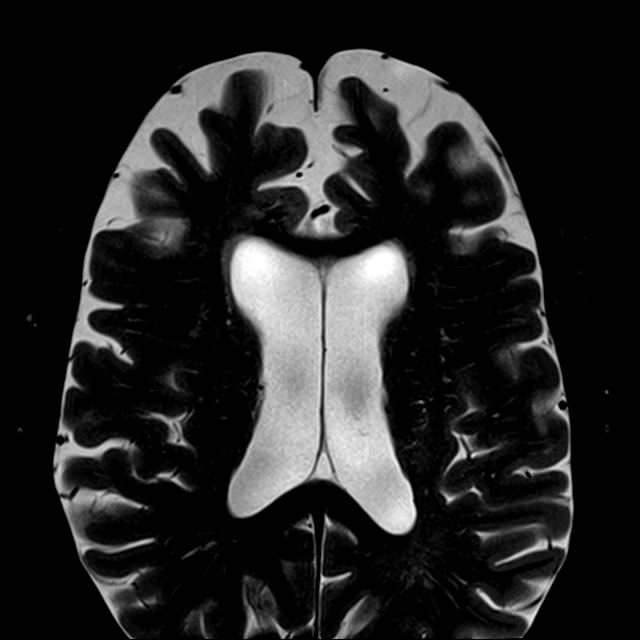

[Series 18: T2 fat-sat · coronal · 2.5mm · 0.25mm/px · 2 of 34 slices shown (2 of 2)]
[im 1/34]
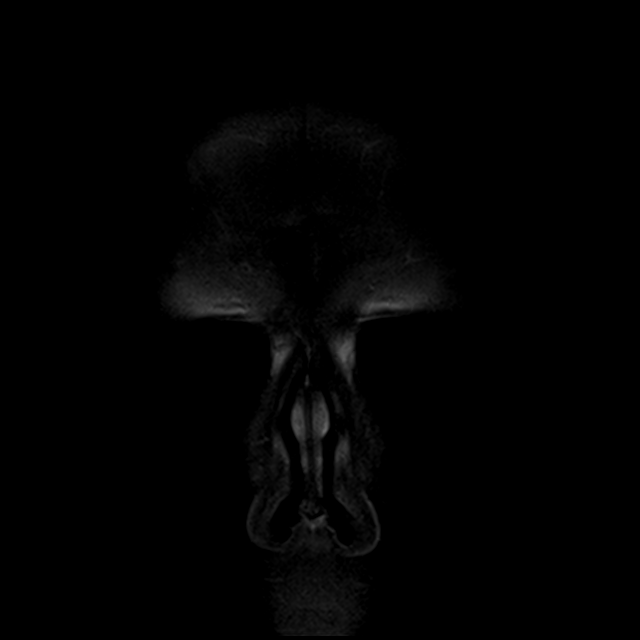
[im 34/34]
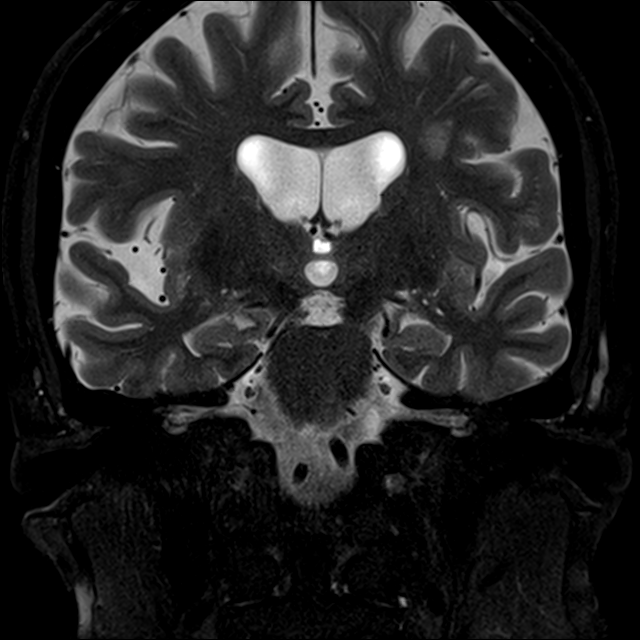

[Series 19: T1 · axial · 2.5mm · 0.25mm/px · 1 of 19 slices shown (3 of 4)]
[im 1/19]
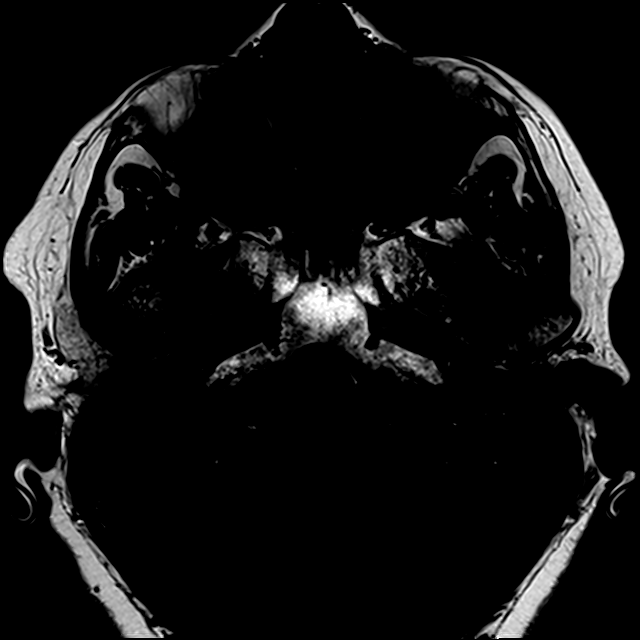

[Series 20: T1 · coronal · 2.5mm · 0.25mm/px · 2 of 34 slices shown (4 of 4)]
[im 1/34]
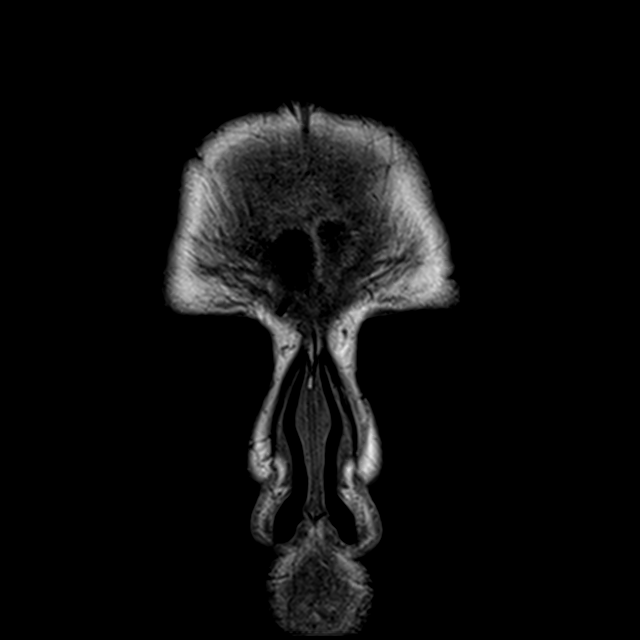
[im 34/34]
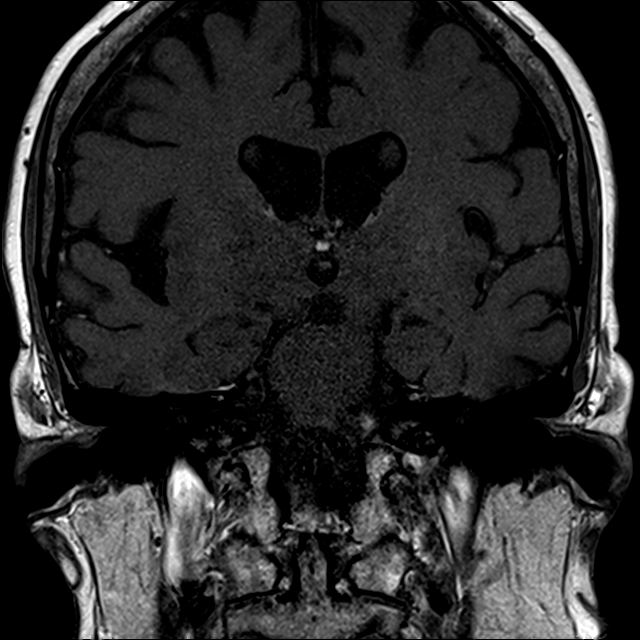

[35 of 48 positions shown; findings below may reference images not displayed]

FINDINGS: Orbits: Both globes appear normal. Native lenses in place. Both
optic nerves appear normal. Extra-ocular muscles are normal. Orbital
fat is normal. Lacrimal glands are normal. Orbital apices are
normal.

Visualized sinuses: Clear

Soft tissues: Otherwise negative.  No evidence of vascular lesion.

Limited intracranial: See results of brain exam. Atrophy and chronic
small-vessel ischemic change.
IMPRESSION: No significant orbital finding.

## 2020-04-11 ENCOUNTER — Ambulatory Visit (INDEPENDENT_AMBULATORY_CARE_PROVIDER_SITE_OTHER): Payer: Medicare FFS | Admitting: Family Medicine

## 2020-04-11 ENCOUNTER — Other Ambulatory Visit: Payer: Self-pay

## 2020-04-11 ENCOUNTER — Encounter: Payer: Self-pay | Admitting: Family Medicine

## 2020-04-11 VITALS — BP 124/84 | HR 72 | Temp 97.6°F | Wt 143.9 lb

## 2020-04-11 DIAGNOSIS — G252 Other specified forms of tremor: Secondary | ICD-10-CM | POA: Diagnosis not present

## 2020-04-11 DIAGNOSIS — E7800 Pure hypercholesterolemia, unspecified: Secondary | ICD-10-CM | POA: Diagnosis not present

## 2020-04-11 NOTE — Progress Notes (Signed)
Virginia Moore DOB: 15-Sep-1936 Encounter date: 04/11/2020  This is a 84 y.o. female who presents with Chief Complaint  Patient presents with  . Hand Problem    left    History of present illness: What she noticed first was when she was signing name the letters were "ugly". Sometimes if going to do simple movement she will get a jerking of the arm. Not all the time, but bothers her.   Hands have been hurting, but she has been moving/unpacking. Has some arthritis in hands, first finger hurts more on and off.   First noticed this after beginning of year.   Does notice if she goes a long time without eating she will get some blurring in vision.   No headaches. Has some chronic dizziness and has seen ENT and had epley completed. Just notes if turning quickly to side.   Had basal cell carcinoma removed from scalp in recent months.   Allergies  Allergen Reactions  . Garlic Diarrhea and Nausea And Vomiting   Current Meds  Medication Sig  . alendronate (FOSAMAX) 70 MG tablet Take 1 tablet (70 mg total) by mouth once a week for 52 doses.  . candesartan (ATACAND) 16 MG tablet Take 1 tablet (16 mg total) by mouth daily.  . Cholecalciferol (VITAMIN D3) 75 MCG (3000 UT) TABS Take by mouth.  . Diclofenac Sodium 1 % CREA Place 4 g onto the skin 4 (four) times daily as needed (Apply a 4g (4.5-in) line to each knee.).  Marland Kitchen famotidine-calcium carbonate-magnesium hydroxide (PEPCID COMPLETE) 10-800-165 MG chewable tablet Chew 1 tablet by mouth daily as needed.  . hydrochlorothiazide (HYDRODIURIL) 12.5 MG tablet Take 1 tablet (12.5 mg total) by mouth every other day.  . meloxicam (MOBIC) 15 MG tablet Take 1 tablet (15 mg total) by mouth daily.  . Multiple Vitamin (MULTIVITAMIN) capsule Take 1 capsule by mouth daily.  . rosuvastatin (CRESTOR) 20 MG tablet Take 1 tablet (20 mg total) by mouth at bedtime.  Marland Kitchen tiZANidine (ZANAFLEX) 2 MG tablet Take 1 tablet (2 mg total) by mouth at bedtime.  . vitamin  C (ASCORBIC ACID) 500 MG tablet Take 1 tablet (500 mg total) by mouth daily.    Review of Systems  Constitutional: Negative for chills, fatigue and fever.  Respiratory: Negative for cough, chest tightness, shortness of breath and wheezing.   Cardiovascular: Negative for chest pain, palpitations and leg swelling.  Neurological: Positive for tremors (left hand; usually while doing only certain things). Negative for dizziness, syncope, speech difficulty, weakness, light-headedness, numbness and headaches.    Objective:  BP 124/84 (BP Location: Left Arm, Patient Position: Sitting, Cuff Size: Normal)   Pulse 72   Temp 97.6 F (36.4 C) (Temporal)   Wt 143 lb 14.4 oz (65.3 kg)   SpO2 96%   BMI 27.19 kg/m   Weight: 143 lb 14.4 oz (65.3 kg)   BP Readings from Last 3 Encounters:  04/11/20 124/84  01/09/20 135/80  08/15/19 (!) 148/90   Wt Readings from Last 3 Encounters:  04/11/20 143 lb 14.4 oz (65.3 kg)  01/09/20 144 lb 9.6 oz (65.6 kg)  08/15/19 144 lb 9.6 oz (65.6 kg)    Physical Exam Constitutional:      General: She is not in acute distress.    Appearance: She is well-developed. She is not diaphoretic.  HENT:     Head: Normocephalic and atraumatic.     Right Ear: External ear normal.     Left Ear: External  ear normal.  Eyes:     Conjunctiva/sclera: Conjunctivae normal.     Pupils: Pupils are equal, round, and reactive to light.  Neck:     Thyroid: No thyromegaly.  Cardiovascular:     Rate and Rhythm: Normal rate and regular rhythm.     Heart sounds: Normal heart sounds. No murmur heard.  No friction rub. No gallop.   Pulmonary:     Effort: Pulmonary effort is normal. No respiratory distress.     Breath sounds: Normal breath sounds. No wheezing or rales.  Musculoskeletal:     Cervical back: Neck supple.  Lymphadenopathy:     Cervical: No cervical adenopathy.  Skin:    General: Skin is warm and dry.  Neurological:     Mental Status: She is alert and oriented to  person, place, and time.     Cranial Nerves: No cranial nerve deficit.     Motor: Tremor (there is intention tremor with finger/nose testing) present. No weakness, atrophy, abnormal muscle tone, seizure activity or pronator drift.     Coordination: Present: slight. Coordination normal. Finger-Nose-Finger Test abnormal. Rapid alternating movements normal.     Gait: Tandem walk abnormal (difficulty with balance doing tandem walk). Gait normal.     Deep Tendon Reflexes: Reflexes normal.     Reflex Scores:      Tricep reflexes are 2+ on the right side and 2+ on the left side.      Bicep reflexes are 2+ on the right side and 2+ on the left side.      Brachioradialis reflexes are 2+ on the right side and 2+ on the left side.      Patellar reflexes are 2+ on the right side and 2+ on the left side. Psychiatric:        Behavior: Behavior normal.     Assessment/Plan  1. Intention tremor We discussed potential causes of this, and agreed on starting with blood work.  If all blood work looks normal, will get MRI to evaluate brain.  She is very worried that there is something going on in the brain that is causing this.  She will be heading out of the country soon but I feel that it should not be an issue to get this completed in the next week or so.  If any new symptoms or worsening of symptoms, advised evaluation. - CBC with Differential/Platelet - Comprehensive metabolic panel - TSH  2. Pure hypercholesterolemia We will check cholesterol since we are drawing blood today. - Lipid panel   Return for pending bloodwork.    Micheline Rough, MD

## 2020-04-12 LAB — COMPREHENSIVE METABOLIC PANEL
AG Ratio: 2 (calc) (ref 1.0–2.5)
ALT: 26 U/L (ref 6–29)
AST: 30 U/L (ref 10–35)
Albumin: 4.4 g/dL (ref 3.6–5.1)
Alkaline phosphatase (APISO): 53 U/L (ref 37–153)
BUN: 18 mg/dL (ref 7–25)
CO2: 27 mmol/L (ref 20–32)
Calcium: 9.5 mg/dL (ref 8.6–10.4)
Chloride: 100 mmol/L (ref 98–110)
Creat: 0.78 mg/dL (ref 0.60–0.88)
Globulin: 2.2 g/dL (calc) (ref 1.9–3.7)
Glucose, Bld: 120 mg/dL — ABNORMAL HIGH (ref 65–99)
Potassium: 3.5 mmol/L (ref 3.5–5.3)
Sodium: 137 mmol/L (ref 135–146)
Total Bilirubin: 1.2 mg/dL (ref 0.2–1.2)
Total Protein: 6.6 g/dL (ref 6.1–8.1)

## 2020-04-12 LAB — CBC WITH DIFFERENTIAL/PLATELET
Absolute Monocytes: 549 cells/uL (ref 200–950)
Basophils Absolute: 63 cells/uL (ref 0–200)
Basophils Relative: 0.7 %
Eosinophils Absolute: 162 cells/uL (ref 15–500)
Eosinophils Relative: 1.8 %
HCT: 35 % (ref 35.0–45.0)
Hemoglobin: 11.9 g/dL (ref 11.7–15.5)
Lymphs Abs: 1269 cells/uL (ref 850–3900)
MCH: 32 pg (ref 27.0–33.0)
MCHC: 34 g/dL (ref 32.0–36.0)
MCV: 94.1 fL (ref 80.0–100.0)
MPV: 8.4 fL (ref 7.5–12.5)
Monocytes Relative: 6.1 %
Neutro Abs: 6957 cells/uL (ref 1500–7800)
Neutrophils Relative %: 77.3 %
Platelets: 238 10*3/uL (ref 140–400)
RBC: 3.72 10*6/uL — ABNORMAL LOW (ref 3.80–5.10)
RDW: 11.7 % (ref 11.0–15.0)
Total Lymphocyte: 14.1 %
WBC: 9 10*3/uL (ref 3.8–10.8)

## 2020-04-12 LAB — LIPID PANEL
Cholesterol: 164 mg/dL (ref <200)
HDL: 77 mg/dL (ref >=50)
LDL Cholesterol (Calc): 62 mg/dL (calc)
Non-HDL Cholesterol (Calc): 87 mg/dL (calc) (ref <130)
Total CHOL/HDL Ratio: 2.1 (calc) (ref <5.0)
Triglycerides: 174 mg/dL — ABNORMAL HIGH (ref <150)

## 2020-04-12 LAB — TSH: TSH: 0.98 mIU/L (ref 0.40–4.50)

## 2020-04-13 ENCOUNTER — Other Ambulatory Visit: Payer: Self-pay | Admitting: Family Medicine

## 2020-04-13 DIAGNOSIS — R251 Tremor, unspecified: Secondary | ICD-10-CM

## 2020-04-22 ENCOUNTER — Telehealth: Payer: Self-pay | Admitting: Internal Medicine

## 2020-04-22 NOTE — Telephone Encounter (Signed)
Patient saw Dr Hassan Rowan last week.  She states she was suppossed to be getting a referral and also needs lab results.  Referral was because she had a tremor in her hand.  She needs a call as soon as possible because she leaves the country in 2 weeks.

## 2020-04-22 NOTE — Telephone Encounter (Signed)
Attempted to call patient, but unable to leave a message.  See result note.

## 2020-04-29 ENCOUNTER — Encounter: Payer: Self-pay | Admitting: Family Medicine

## 2020-05-05 ENCOUNTER — Other Ambulatory Visit: Payer: Self-pay | Admitting: Family Medicine

## 2020-05-06 ENCOUNTER — Other Ambulatory Visit: Payer: Self-pay | Admitting: *Deleted

## 2020-05-06 DIAGNOSIS — I10 Essential (primary) hypertension: Secondary | ICD-10-CM

## 2020-05-06 MED ORDER — HYDROCHLOROTHIAZIDE 12.5 MG PO TABS: 12.5000 mg | ORAL_TABLET | Freq: Every day | ORAL | 1 refills | Status: DC

## 2020-05-12 ENCOUNTER — Telehealth: Payer: Self-pay | Admitting: Internal Medicine

## 2020-05-12 NOTE — Telephone Encounter (Signed)
Pt is calling in stating that she is going to [place] on Thursday 05/15/2020 and will be gone for 6 months and would like to see if she can get a prescription of the MRI that she was to get from [facility] on 06/09/2020.  Pt did not want to call [facility] to see if she can get something this Tuesday (7/20) or Wednesday 7/21) she prefer to get it done in [place]  Pt would like to have a call back to let her know if this can be done.

## 2020-05-14 NOTE — Telephone Encounter (Signed)
We can try to do this. What I would suggest is that she locate a facility there that can handle this and check to make sure that insurance will cover there; then we can fax order for her if she can let us know.

## 2020-05-14 NOTE — Telephone Encounter (Signed)
Copy of the order for the MRI was left at the front desk for pick up.  Unable to leave a voicemail due to message stating call cannot be completed at this time.

## 2020-10-27 ENCOUNTER — Other Ambulatory Visit: Payer: Self-pay | Admitting: Internal Medicine

## 2020-10-27 DIAGNOSIS — I10 Essential (primary) hypertension: Secondary | ICD-10-CM

## 2024-01-03 ENCOUNTER — Ambulatory Visit: Payer: Self-pay | Admitting: Internal Medicine

## 2024-01-03 ENCOUNTER — Observation Stay (HOSPITAL_BASED_OUTPATIENT_CLINIC_OR_DEPARTMENT_OTHER)
Admission: EM | Admit: 2024-01-03 | Discharge: 2024-01-04 | Disposition: A | Discharge status: Home / Self Care | Attending: Surgery | Admitting: Surgery

## 2024-01-03 ENCOUNTER — Other Ambulatory Visit: Payer: Self-pay

## 2024-01-03 ENCOUNTER — Emergency Department (HOSPITAL_BASED_OUTPATIENT_CLINIC_OR_DEPARTMENT_OTHER)

## 2024-01-03 ENCOUNTER — Encounter (HOSPITAL_BASED_OUTPATIENT_CLINIC_OR_DEPARTMENT_OTHER): Payer: Self-pay | Admitting: Emergency Medicine

## 2024-01-03 DIAGNOSIS — K353 Acute appendicitis with localized peritonitis, without perforation or gangrene: Principal | ICD-10-CM | POA: Insufficient documentation

## 2024-01-03 DIAGNOSIS — K3580 Unspecified acute appendicitis: Secondary | ICD-10-CM | POA: Diagnosis present

## 2024-01-03 DIAGNOSIS — I10 Essential (primary) hypertension: Secondary | ICD-10-CM | POA: Diagnosis not present

## 2024-01-03 DIAGNOSIS — Z79899 Other long term (current) drug therapy: Secondary | ICD-10-CM | POA: Insufficient documentation

## 2024-01-03 DIAGNOSIS — R109 Unspecified abdominal pain: Secondary | ICD-10-CM | POA: Diagnosis present

## 2024-01-03 DIAGNOSIS — Z7982 Long term (current) use of aspirin: Secondary | ICD-10-CM | POA: Insufficient documentation

## 2024-01-03 LAB — URINALYSIS, MICROSCOPIC (REFLEX)
Bacteria, UA: NONE SEEN
WBC, UA: >50 WBC/hpf (ref 0–5)

## 2024-01-03 LAB — CBC
HCT: 37.5 % (ref 36.0–46.0)
Hemoglobin: 12.6 g/dL (ref 12.0–15.0)
MCH: 31.3 pg (ref 26.0–34.0)
MCHC: 33.6 g/dL (ref 30.0–36.0)
MCV: 93.3 fL (ref 80.0–100.0)
Platelets: 197 10*3/uL (ref 150–400)
RBC: 4.02 MIL/uL (ref 3.87–5.11)
RDW: 12.2 % (ref 11.5–15.5)
WBC: 9.4 10*3/uL (ref 4.0–10.5)
nRBC: 0 % (ref 0.0–0.2)

## 2024-01-03 LAB — COMPREHENSIVE METABOLIC PANEL
ALT: 14 U/L (ref 0–44)
AST: 18 U/L (ref 15–41)
Albumin: 4.8 g/dL (ref 3.5–5.0)
Alkaline Phosphatase: 52 U/L (ref 38–126)
Anion gap: 10 (ref 5–15)
BUN: 13 mg/dL (ref 8–23)
CO2: 27 mmol/L (ref 22–32)
Calcium: 9.5 mg/dL (ref 8.9–10.3)
Chloride: 99 mmol/L (ref 98–111)
Creatinine, Ser: 0.66 mg/dL (ref 0.44–1.00)
GFR, Estimated: >60 mL/min (ref >60)
Glucose, Bld: 98 mg/dL (ref 70–99)
Potassium: 3.6 mmol/L (ref 3.5–5.1)
Sodium: 136 mmol/L (ref 135–145)
Total Bilirubin: 2.9 mg/dL — ABNORMAL HIGH (ref 0.0–1.2)
Total Protein: 7.4 g/dL (ref 6.5–8.1)

## 2024-01-03 LAB — URINALYSIS, ROUTINE W REFLEX MICROSCOPIC
Bilirubin Urine: NEGATIVE
Glucose, UA: NEGATIVE mg/dL
Ketones, ur: NEGATIVE mg/dL
Nitrite: NEGATIVE
Protein, ur: NEGATIVE mg/dL
Specific Gravity, Urine: 1.01 (ref 1.005–1.030)
pH: 6 (ref 5.0–8.0)

## 2024-01-03 LAB — LIPASE, BLOOD: Lipase: 36 U/L (ref 11–51)

## 2024-01-03 MED ORDER — LACTATED RINGERS IV BOLUS
1000.0000 mL | Freq: Once | INTRAVENOUS | Status: AC
  Administered 2024-01-03: 1000 mL via INTRAVENOUS

## 2024-01-03 MED ORDER — SODIUM CHLORIDE 0.9 % IV SOLN: INTRAVENOUS | Status: AC

## 2024-01-03 MED ORDER — SODIUM CHLORIDE 0.9 % IV SOLN
2.0000 g | Freq: Once | INTRAVENOUS | Status: AC
  Administered 2024-01-03: 2 g via INTRAVENOUS
  Filled 2024-01-03: qty 20

## 2024-01-03 MED ORDER — IOHEXOL 300 MG/ML  SOLN
100.0000 mL | Freq: Once | INTRAMUSCULAR | Status: AC | PRN
  Administered 2024-01-03: 100 mL via INTRAVENOUS

## 2024-01-03 MED ORDER — METRONIDAZOLE 500 MG/100ML IV SOLN
500.0000 mg | Freq: Once | INTRAVENOUS | Status: AC
  Administered 2024-01-03: 500 mg via INTRAVENOUS
  Filled 2024-01-03: qty 100

## 2024-01-03 NOTE — Telephone Encounter (Signed)
 Chief Complaint: abdominal pain Symptoms: abdominal pain, chills, nausea, distension, bloating, burping Frequency: x 3 days Pertinent Negatives: Patient denies vomiting, diarrhea, fever, painful urination, blood in stool. Disposition: [x] ED /[x] Urgent Care (no appt availability in office) / [] Appointment(In office/virtual)/ []  [facility]/ [] Home Care/ [] Refused Recommended Disposition /[]  Mobile Bus/ []  Follow-up with PCP Additional Notes: Patient states she had a fish taco to eat Sunday afternoon and started to have abdominal pain after. She states that by Monday morning it was painful to touch her abdomen. Patient states yesterday afternoon she  travelled via airplane and was having the pain (she states she lives in [place] but is in town visiting her daughter). She states she has been able to eat and drink. She tried using a heating pad, Gas-X, Pepto Bismol which helped but states she still has the pain today. She reports she had a bowel movement yesterday. Patient has not seen [facility] since 2021. She states she does not wish to establish as a new patient for PCP. Advised patient to go to urgent care or ED and be seen/evaluated today.  Copied from CRM [id number]. Topic: Clinical - Red Word Triage >> Jan 03, 2024 11:00 AM Aletta Edouard wrote: [facility] that prompted transfer to Nurse Triage: patient has stomach pain when she touches her stomach its distended and hurts really bad this has been going on for two days Reason for Disposition  [1] MILD-MODERATE pain AND [2] constant AND [3] present > 2 hours  Answer Assessment - Initial Assessment Questions 1. LOCATION: "Where does it hurt?"      Majority of the abdomen but primarily the lower abdomen, "all over"  2. RADIATION: "Does the pain shoot anywhere else?" (e.g., chest, back)     Denies.  3. ONSET: "When did the pain begin?" (e.g., minutes, hours or days ago)      Sunday.  4. SUDDEN: "Gradual or sudden  onset?"     Gradual.  5. PATTERN "Does the pain come and go, or is it constant?"    - If it comes and goes: "How long does it last?" "Do you have pain now?"     (Note: Comes and goes means the pain is intermittent. It goes away completely between bouts.)    - If constant: "Is it getting better, staying the same, or getting worse?"      (Note: Constant means the pain never goes away completely; most serious pain is constant and gets worse.)      Constant, present.  6. SEVERITY: "How bad is the pain?"  (e.g., Scale 1-10; mild, moderate, or severe)    - MILD (1-3): Doesn't interfere with normal activities, abdomen soft and not tender to touch.     - MODERATE (4-7): Interferes with normal activities or awakens from sleep, abdomen tender to touch.     - SEVERE (8-10): Excruciating pain, doubled over, unable to do any normal activities.       She states when she touches her abdomen it is a 6-7/10.  7. RECURRENT SYMPTOM: "Have you ever had this type of stomach pain before?" If Yes, ask: "When was the last time?" and "What happened that time?"      Denies.  8. CAUSE: "What do you think is causing the stomach pain?"     Unsure. She states the only thing she can think of is the fish taco she ate on Sunday.  9. RELIEVING/AGGRAVATING FACTORS: "What makes it better or worse?" (e.g., antacids, bending  or twisting motion, bowel movement)     Heating pad and Gas-X have helped.  10. OTHER SYMPTOMS: "Do you have any other symptoms?" (e.g., back pain, diarrhea, fever, urination pain, vomiting)       Distension, bloating, nausea (yesterday).  Protocols used: Abdominal Pain - Female-A-AH

## 2024-01-03 NOTE — Anesthesia Preprocedure Evaluation (Signed)
 Anesthesia Evaluation  Patient identified by MRN, date of birth, ID band Patient awake    Reviewed: Allergy & Precautions, NPO status , Patient's Chart, lab work & pertinent test results  History of Anesthesia Complications Negative for: history of anesthetic complications  Airway Mallampati: III  TM Distance: >3 FB Neck ROM: Full    Dental  (+) Dental Advisory Given   Pulmonary neg pulmonary ROS   Pulmonary exam normal breath sounds clear to auscultation       Cardiovascular hypertension (HCTZ), Pt. on medications (-) angina (-) Past MI, (-) Cardiac Stents and (-) CABG (-) dysrhythmias  Rhythm:Regular Rate:Normal  HLD   Neuro/Psych neg Seizures vertigo  Neuromuscular disease (cervical stenosis, lumbar radiculitis)    GI/Hepatic Neg liver ROS, hiatal hernia,,,  Endo/Other  negative endocrine ROS    Renal/GU negative Renal ROS     Musculoskeletal  (+) Arthritis  (osteoporosis), Osteoarthritis,    Abdominal   Peds  Hematology negative hematology ROS (+) Lab Results      Component                Value               Date                      WBC                      9.4                 01/03/2024                HGB                      12.6                01/03/2024                HCT                      37.5                01/03/2024                MCV                      93.3                01/03/2024                PLT                      197                 01/03/2024              Anesthesia Other Findings   Reproductive/Obstetrics                             Anesthesia Physical Anesthesia Plan  ASA: 2  Anesthesia Plan: General   Post-op Pain Management:    Induction: Intravenous  PONV Risk Score and Plan: 3 and Ondansetron, Dexamethasone and Treatment may vary due to age or medical condition  Airway Management Planned: Oral ETT  Additional Equipment:   Intra-op Plan:    Post-operative Plan: Extubation in OR  Informed Consent: I have  reviewed the patients History and Physical, chart, labs and discussed the procedure including the risks, benefits and alternatives for the proposed anesthesia with the patient or authorized representative who has indicated his/her understanding and acceptance.     Dental advisory given  Plan Discussed with: CRNA and Anesthesiologist  Anesthesia Plan Comments: (Risks of general anesthesia discussed including, but not limited to, sore throat, hoarse voice, chipped/damaged teeth, injury to vocal cords, nausea and vomiting, allergic reactions, lung infection, heart attack, stroke, and death. All questions answered. )       Anesthesia Quick Evaluation

## 2024-01-03 NOTE — ED Triage Notes (Signed)
 Abdo pain started Sunday Pain in lower abdo, tender to touch guarding Bloating, burping Not relieved with beano pepto or gas x  Seen at [facility] and sent for eval  Recent travel from [place]

## 2024-01-03 NOTE — ED Provider Notes (Signed)
 [facility] AT [facility] Provider Note   CSN: [id number] Arrival date & time: 01/03/24  1325     History  Chief Complaint  Patient presents with   Abdominal Pain    Virginia Moore Shows is a 88 y.o. female.  Patient is an 88 year old female with a history of hypertension, hiatal hernia and prior abdominal hysterectomy who is presenting today with complaints of abdominal pain.  She reports the pain started on Sunday evening but has been gradually persisting and worsening since that time.  Last normal bowel movement was yesterday and reported a little bit of improvement in the pain after the bowel movement.  She has not really been eating because she was worried it would make the feeling of distention and bloating worse.  She has not had any vomiting or fever.  She did return from [place] on Sunday but reports she had lived there for most of her life.  She ate similar food to her other family members and everybody else is feeling fine.  She has not had a fever.  She denies any urinary symptoms.  No recent medication changes.  She did try some over-the-counter medication for the stomach pain but it was not helpful.  The history is provided by the patient and a relative.  Abdominal Pain      Home Medications Prior to Admission medications   Medication Sig Start Date End Date Taking? Authorizing Provider  alendronate (FOSAMAX) 70 MG tablet Take 1 tablet (70 mg total) by mouth once a week for 52 doses. 03/20/20 03/13/21  Philip Aspen, Limmie Patricia, MD  aspirin 81 MG chewable tablet Chew 1 tablet (81 mg total) by mouth daily. 10/05/16 01/09/20  Delano Metz, MD  candesartan (ATACAND) 16 MG tablet Take 1 tablet (16 mg total) by mouth daily. 03/20/20 03/20/21  Philip Aspen, Limmie Patricia, MD  Cholecalciferol (VITAMIN D3) 75 MCG (3000 UT) TABS Take by mouth.    [provider]  Diclofenac Sodium 1 % CREA Place 4 g onto the skin 4 (four) times daily as  needed (Apply a 4g (4.5-in) line to each knee.). 07/23/18 04/11/20  Delano Metz, MD  famotidine-calcium carbonate-magnesium hydroxide (PEPCID COMPLETE) 10-800-165 MG chewable tablet Chew 1 tablet by mouth daily as needed. 09/18/19 09/17/20  Delano Metz, MD  glucosamine-chondroitin 500-400 MG tablet Take 1 tablet by mouth daily. 07/23/18 08/15/19  Delano Metz, MD  hydrochlorothiazide (HYDRODIURIL) 12.5 MG tablet TAKE 1 TABLET(12.5 MG) BY MOUTH DAILY 10/28/20   Philip Aspen, Limmie Patricia, MD  meloxicam (MOBIC) 15 MG tablet Take 1 tablet (15 mg total) by mouth daily. 09/18/19 09/17/20  Delano Metz, MD  Multiple Vitamin (MULTIVITAMIN) capsule Take 1 capsule by mouth daily. 10/05/16 04/11/20  Delano Metz, MD  rosuvastatin (CRESTOR) 20 MG tablet Take 1 tablet (20 mg total) by mouth at bedtime. 03/20/20 03/20/21  Philip Aspen, Limmie Patricia, MD  tiZANidine (ZANAFLEX) 2 MG tablet Take 1 tablet (2 mg total) by mouth at bedtime. 09/18/19 09/17/20  Delano Metz, MD  vitamin C (ASCORBIC ACID) 500 MG tablet Take 1 tablet (500 mg total) by mouth daily. 10/05/16 04/11/20  Delano Metz, MD      Allergies    Garlic    Review of Systems   Review of Systems  Gastrointestinal:  Positive for abdominal pain.    Physical Exam Updated Vital Signs BP (!) 165/94   Pulse 86   Temp 98.2 F (36.8 C) (Oral)   Resp 15   SpO2  97%  Physical Exam Vitals and nursing note reviewed.  Constitutional:      General: She is not in acute distress.    Appearance: She is well-developed.  HENT:     Head: Normocephalic and atraumatic.  Eyes:     Pupils: Pupils are equal, round, and reactive to light.  Cardiovascular:     Rate and Rhythm: Normal rate and regular rhythm.     Heart sounds: Normal heart sounds. No murmur heard.    No friction rub.  Pulmonary:     Effort: Pulmonary effort is normal.     Breath sounds: Normal breath sounds. No wheezing or rales.  Abdominal:     General:  Bowel sounds are normal. There is no distension.     Palpations: Abdomen is soft.     Tenderness: There is abdominal tenderness in the right lower quadrant and suprapubic area. There is no right CVA tenderness, left CVA tenderness, guarding or rebound.  Musculoskeletal:        General: No tenderness. Normal range of motion.     Comments: No edema  Skin:    General: Skin is warm and dry.     Findings: No rash.  Neurological:     Mental Status: She is alert and oriented to person, place, and time. Mental status is at baseline.     Cranial Nerves: No cranial nerve deficit.  Psychiatric:        Behavior: Behavior normal.     ED Results / Procedures / Treatments   Labs (all labs ordered are listed, but only abnormal results are displayed) Labs Reviewed  COMPREHENSIVE METABOLIC PANEL - Abnormal; Notable for the following components:      Result Value   Total Bilirubin 2.9 (*)    All other components within normal limits  URINALYSIS, ROUTINE W REFLEX MICROSCOPIC - Abnormal; Notable for the following components:   Hgb urine dipstick LARGE (*)    Leukocytes,Ua LARGE (*)    All other components within normal limits  URINALYSIS, MICROSCOPIC (REFLEX) - Abnormal; Notable for the following components:   Non Squamous Epithelial PRESENT (*)    All other components within normal limits  LIPASE, BLOOD  CBC    EKG None  Radiology CT ABDOMEN PELVIS W CONTRAST Result Date: 01/03/2024 CLINICAL DATA:  RLQ abdominal pain lower abdo, tender to touch guarding Bloating, burping Not relieved with beano pepto or gas x Seen at [facility] and sent for eval Recent travel from [place] EXAM: CT ABDOMEN AND PELVIS WITH CONTRAST TECHNIQUE: Multidetector CT imaging of the abdomen and pelvis was performed using the standard protocol following bolus administration of intravenous contrast. RADIATION DOSE REDUCTION: This exam was performed according to the departmental dose-optimization program which includes automated exposure  control, adjustment of the mA and/or kV according to patient size and/or use of iterative reconstruction technique. CONTRAST:  OMNIPAQUE IOHEXOL 300 MG/ML  SOLN COMPARISON:  None Available. FINDINGS: Lower chest: No acute abnormality. Hepatobiliary: There is a 1 cm hypodensity of the left hepatic lobe and subcentimeter hypodensity too small to characterize. Otherwise no focal liver abnormality. Calcified gallstone noted within the gallbladder lumen. No gallbladder wall thickening or pericholecystic fluid. No biliary dilatation. Pancreas: No focal lesion. Normal pancreatic contour. No surrounding inflammatory changes. No main pancreatic ductal dilatation. Spleen: Normal in size without focal abnormality.  Splenule noted. Adrenals/Urinary Tract: No adrenal nodule bilaterally. Bilateral kidneys enhance symmetrically. Sub sent wise is not working subcentimeter hypodensities too small to characterize-no further follow-up. Extrarenal left pelvis.  No hydronephrosis. No hydroureter.  No nephroureterolithiasis. The urinary bladder is unremarkable. Stomach/Bowel: Stomach is within normal limits. No evidence of small bowel wall thickening or dilatation. Second portion of the duodenum diverticula. Colonic diverticulosis. The appendix is enlarged in caliber measuring up to 1.2 cm with associated fluid-filled lumen and appendiceal wall enhancement/thickening. Associated periappendiceal fat stranding. Question punctate appendicoliths within its lumen. No appendiceal wall discontinuity. Associated cecal wall bowel thickening. Vascular/Lymphatic: No abdominal aorta or iliac aneurysm. At least moderate atherosclerotic plaque of the aorta and its branches. No abdominal, pelvic, or inguinal lymphadenopathy. Reproductive: Status post hysterectomy. No adnexal masses. Other: No intraperitoneal free fluid. No intraperitoneal free gas. No organized fluid collection. Musculoskeletal: No abdominal wall hernia or abnormality. No  suspicious lytic or blastic osseous lesions. No acute displaced fracture. Grade 1 anterolisthesis of L4 on L5. IMPRESSION: 1. Non-perforated acute appendicitis with question associated punctate appendicoliths. 2. Likely reactive changes of the adjacent cecum. 3. Colonic diverticulosis with no acute diverticulitis. 4.  Aortic Atherosclerosis (ICD10-I70.0). Electronically Signed   By: Tish Frederickson M.D.   On: 01/03/2024 19:44    Procedures Procedures    Medications Ordered in ED Medications  cefTRIAXone (ROCEPHIN) 2 g in sodium chloride 0.9 % 100 mL IVPB (2 g Intravenous New Bag/Given 01/03/24 2020)    And  metroNIDAZOLE (FLAGYL) IVPB 500 mg (has no administration in time range)  0.9 %  sodium chloride infusion (has no administration in time range)  lactated ringers bolus 1,000 mL (0 mLs Intravenous Stopped 01/03/24 1904)  iohexol (OMNIPAQUE) 300 MG/ML solution 100 mL (100 mLs Intravenous Contrast Given 01/03/24 1655)    ED Course/ Medical Decision Making/ A&P                                 Medical Decision Making Amount and/or Complexity of Data Reviewed External Data Reviewed: notes. Labs: ordered. Decision-making details documented in ED Course. Radiology: ordered and independent interpretation performed. Decision-making details documented in ED Course.  Risk Prescription drug management. Decision regarding hospitalization.   Pt with multiple medical problems and comorbidities and presenting today with a complaint that caries a high risk for morbidity and mortality.  Here today with plaint of abdominal pain.  Concern for appendicitis, renal stone, diverticulitis, colitis.  Lower suspicion for small bowel obstruction, UTI or pyelonephritis.  Patient has had abdominal hysterectomy and low suspicion for GU pathology.  Lower suspicion for cholecystitis, hepatitis or pancreatitis based on exam I independently interpreted patient's labs and lipase, CBC are within normal limits.  CMP normal  except for mild elevated total bilirubin of 2.9, patient was unable to give a urine sample because she reports she has not really eaten or drank anything in the last few days.  CT to further evaluate is pending.  Patient given IV fluids in the meantime but reports she does not need nausea or pain medication at this time.  8:33 PM I have independently visualized and interpreted pt's images today.  CT of the abdomen pelvis consistent with appendicitis.  Radiology reports acute appendicitis without perforation with a up appendicolith.  Spoke with general surgery Dr. Dossie Der and patient will be admitted for appendectomy.  This was discussed with the patient and she is comfortable with this plan.  She was given Rocephin and Flagyl.          Final Clinical Impression(s) / ED Diagnoses Final diagnoses:  Acute appendicitis with localized peritonitis, without perforation, abscess,  or gangrene    Rx / DC Orders ED Discharge Orders     None         Gwyneth Sprout, MD 01/03/24 2033

## 2024-01-03 NOTE — ED Notes (Signed)
 Called [facility] to transport patient to [facility] Rm# [id number]

## 2024-01-03 NOTE — ED Notes (Signed)
 Pt aware of the need for a urine... Unable to currently provide the sample.Virginia KitchenMarland Moore

## 2024-01-04 ENCOUNTER — Encounter (HOSPITAL_COMMUNITY)
Admission: EM | Disposition: A | Discharge status: Home / Self Care | Payer: Self-pay | Source: Home / Self Care | Attending: Emergency Medicine

## 2024-01-04 ENCOUNTER — Telehealth: Payer: Self-pay

## 2024-01-04 ENCOUNTER — Other Ambulatory Visit: Payer: Self-pay

## 2024-01-04 ENCOUNTER — Encounter (HOSPITAL_COMMUNITY): Payer: Self-pay

## 2024-01-04 ENCOUNTER — Inpatient Hospital Stay (HOSPITAL_BASED_OUTPATIENT_CLINIC_OR_DEPARTMENT_OTHER): Payer: Self-pay | Admitting: Anesthesiology

## 2024-01-04 ENCOUNTER — Inpatient Hospital Stay (HOSPITAL_COMMUNITY): Payer: Self-pay | Admitting: Anesthesiology

## 2024-01-04 DIAGNOSIS — K3580 Unspecified acute appendicitis: Secondary | ICD-10-CM

## 2024-01-04 DIAGNOSIS — Z7982 Long term (current) use of aspirin: Secondary | ICD-10-CM | POA: Diagnosis not present

## 2024-01-04 DIAGNOSIS — I10 Essential (primary) hypertension: Secondary | ICD-10-CM | POA: Diagnosis not present

## 2024-01-04 DIAGNOSIS — K353 Acute appendicitis with localized peritonitis, without perforation or gangrene: Secondary | ICD-10-CM | POA: Diagnosis not present

## 2024-01-04 DIAGNOSIS — Z79899 Other long term (current) drug therapy: Secondary | ICD-10-CM | POA: Diagnosis not present

## 2024-01-04 HISTORY — PX: LAPAROSCOPIC APPENDECTOMY: SHX408

## 2024-01-04 LAB — CREATININE, SERUM
Creatinine, Ser: 0.61 mg/dL (ref 0.44–1.00)
GFR, Estimated: >60 mL/min (ref >60)

## 2024-01-04 LAB — BASIC METABOLIC PANEL
Anion gap: 10 (ref 5–15)
BUN: 10 mg/dL (ref 8–23)
CO2: 23 mmol/L (ref 22–32)
Calcium: 8.8 mg/dL — ABNORMAL LOW (ref 8.9–10.3)
Chloride: 103 mmol/L (ref 98–111)
Creatinine, Ser: 0.89 mg/dL (ref 0.44–1.00)
GFR, Estimated: >60 mL/min (ref >60)
Glucose, Bld: 72 mg/dL (ref 70–99)
Potassium: 3.7 mmol/L (ref 3.5–5.1)
Sodium: 136 mmol/L (ref 135–145)

## 2024-01-04 LAB — CBC
HCT: 33.4 % — ABNORMAL LOW (ref 36.0–46.0)
HCT: 34.1 % — ABNORMAL LOW (ref 36.0–46.0)
Hemoglobin: 11.6 g/dL — ABNORMAL LOW (ref 12.0–15.0)
Hemoglobin: 11.8 g/dL — ABNORMAL LOW (ref 12.0–15.0)
MCH: 32.2 pg (ref 26.0–34.0)
MCH: 32.3 pg (ref 26.0–34.0)
MCHC: 34.6 g/dL (ref 30.0–36.0)
MCHC: 34.7 g/dL (ref 30.0–36.0)
MCV: 92.8 fL (ref 80.0–100.0)
MCV: 93.4 fL (ref 80.0–100.0)
Platelets: 165 10*3/uL (ref 150–400)
Platelets: 169 10*3/uL (ref 150–400)
RBC: 3.6 MIL/uL — ABNORMAL LOW (ref 3.87–5.11)
RBC: 3.65 MIL/uL — ABNORMAL LOW (ref 3.87–5.11)
RDW: 11.9 % (ref 11.5–15.5)
RDW: 11.9 % (ref 11.5–15.5)
WBC: 7.6 10*3/uL (ref 4.0–10.5)
WBC: 9.6 10*3/uL (ref 4.0–10.5)
nRBC: 0 % (ref 0.0–0.2)
nRBC: 0 % (ref 0.0–0.2)

## 2024-01-04 LAB — SURGICAL PCR SCREEN
MRSA, PCR: NEGATIVE
Staphylococcus aureus: NEGATIVE

## 2024-01-04 SURGERY — APPENDECTOMY, LAPAROSCOPIC
Anesthesia: General | Site: Abdomen

## 2024-01-04 MED ORDER — METRONIDAZOLE 500 MG/100ML IV SOLN
500.0000 mg | Freq: Two times a day (BID) | INTRAVENOUS | Status: DC
  Administered 2024-01-04: 500 mg via INTRAVENOUS
  Filled 2024-01-04: qty 100

## 2024-01-04 MED ORDER — LACTATED RINGERS IV SOLN: INTRAVENOUS | Status: DC

## 2024-01-04 MED ORDER — FENTANYL CITRATE (PF) 250 MCG/5ML IJ SOLN
INTRAMUSCULAR | Status: DC | PRN
  Administered 2024-01-04 (×3): 50 ug via INTRAVENOUS

## 2024-01-04 MED ORDER — OXYCODONE HCL 5 MG PO TABS: 10.0000 mg | ORAL_TABLET | ORAL | Status: DC | PRN

## 2024-01-04 MED ORDER — DOCUSATE SODIUM 100 MG PO CAPS: 100.0000 mg | ORAL_CAPSULE | Freq: Two times a day (BID) | ORAL | Status: DC

## 2024-01-04 MED ORDER — 0.9 % SODIUM CHLORIDE (POUR BTL) OPTIME
TOPICAL | Status: DC | PRN
  Administered 2024-01-04: 1000 mL

## 2024-01-04 MED ORDER — BUPIVACAINE-EPINEPHRINE 0.25% -1:200000 IJ SOLN
INTRAMUSCULAR | Status: DC | PRN
  Administered 2024-01-04: 26 mL

## 2024-01-04 MED ORDER — LIDOCAINE 2% (20 MG/ML) 5 ML SYRINGE
INTRAMUSCULAR | Status: DC | PRN
  Administered 2024-01-04: 80 mg via INTRAVENOUS

## 2024-01-04 MED ORDER — CHLORHEXIDINE GLUCONATE 0.12 % MT SOLN: 15.0000 mL | Freq: Once | OROMUCOSAL | Status: AC

## 2024-01-04 MED ORDER — PROPOFOL 10 MG/ML IV BOLUS
INTRAVENOUS | Status: AC
  Filled 2024-01-04: qty 20

## 2024-01-04 MED ORDER — ROCURONIUM BROMIDE 10 MG/ML (PF) SYRINGE
PREFILLED_SYRINGE | INTRAVENOUS | Status: DC | PRN
  Administered 2024-01-04: 50 mg via INTRAVENOUS

## 2024-01-04 MED ORDER — ACETAMINOPHEN 325 MG PO TABS: 650.0000 mg | ORAL_TABLET | Freq: Four times a day (QID) | ORAL | Status: DC | PRN

## 2024-01-04 MED ORDER — BUPIVACAINE-EPINEPHRINE (PF) 0.25% -1:200000 IJ SOLN
INTRAMUSCULAR | Status: AC
  Filled 2024-01-04: qty 30

## 2024-01-04 MED ORDER — OXYCODONE HCL 5 MG PO TABS: 5.0000 mg | ORAL_TABLET | ORAL | Status: DC | PRN

## 2024-01-04 MED ORDER — STERILE WATER FOR IRRIGATION IR SOLN
Status: DC | PRN
  Administered 2024-01-04: 1000 mL

## 2024-01-04 MED ORDER — HYDROMORPHONE HCL 1 MG/ML IJ SOLN: 0.5000 mg | INTRAMUSCULAR | Status: DC | PRN

## 2024-01-04 MED ORDER — SODIUM CHLORIDE 0.9 % IR SOLN
Status: DC | PRN
  Administered 2024-01-04: 1000 mL

## 2024-01-04 MED ORDER — ONDANSETRON HCL 4 MG/2ML IJ SOLN
INTRAMUSCULAR | Status: DC | PRN
  Administered 2024-01-04: 4 mg via INTRAVENOUS

## 2024-01-04 MED ORDER — SUGAMMADEX SODIUM 200 MG/2ML IV SOLN
INTRAVENOUS | Status: AC
  Filled 2024-01-04: qty 2

## 2024-01-04 MED ORDER — SODIUM CHLORIDE 0.9 % IV SOLN: 2.0000 g | INTRAVENOUS | Status: DC

## 2024-01-04 MED ORDER — ACETAMINOPHEN 10 MG/ML IV SOLN
INTRAVENOUS | Status: AC
  Filled 2024-01-04: qty 100

## 2024-01-04 MED ORDER — ENOXAPARIN SODIUM 40 MG/0.4ML IJ SOSY: 40.0000 mg | PREFILLED_SYRINGE | INTRAMUSCULAR | Status: DC

## 2024-01-04 MED ORDER — AMISULPRIDE (ANTIEMETIC) 5 MG/2ML IV SOLN: 10.0000 mg | Freq: Once | INTRAVENOUS | Status: DC | PRN

## 2024-01-04 MED ORDER — PROPOFOL 10 MG/ML IV BOLUS
INTRAVENOUS | Status: DC | PRN
  Administered 2024-01-04: 20 mg via INTRAVENOUS
  Administered 2024-01-04: 60 mg via INTRAVENOUS
  Administered 2024-01-04: 20 mg via INTRAVENOUS

## 2024-01-04 MED ORDER — ACETAMINOPHEN 325 MG PO TABS
650.0000 mg | ORAL_TABLET | Freq: Four times a day (QID) | ORAL | Status: DC
Start: 2024-01-04 — End: 2024-01-04
  Administered 2024-01-04: 650 mg via ORAL
  Filled 2024-01-04: qty 2

## 2024-01-04 MED ORDER — CHLORHEXIDINE GLUCONATE 0.12 % MT SOLN
OROMUCOSAL | Status: AC
Start: 2024-01-04 — End: 2024-01-04
  Filled 2024-01-04: qty 15

## 2024-01-04 MED ORDER — FENTANYL CITRATE (PF) 100 MCG/2ML IJ SOLN
INTRAMUSCULAR | Status: AC
  Filled 2024-01-04: qty 2

## 2024-01-04 MED ORDER — CHLORHEXIDINE GLUCONATE CLOTH 2 % EX PADS
6.0000 | MEDICATED_PAD | Freq: Once | CUTANEOUS | Status: AC
  Administered 2024-01-04: 6 via TOPICAL

## 2024-01-04 MED ORDER — FENTANYL CITRATE (PF) 100 MCG/2ML IJ SOLN
25.0000 ug | INTRAMUSCULAR | Status: DC | PRN
Start: 2024-01-04 — End: 2024-01-04
  Administered 2024-01-04: 50 ug via INTRAVENOUS

## 2024-01-04 MED ORDER — HYDROCODONE-ACETAMINOPHEN 5-325 MG PO TABS
1.0000 | ORAL_TABLET | Freq: Four times a day (QID) | ORAL | 0 refills | Status: AC | PRN
Start: 2024-01-04 — End: 2024-01-09

## 2024-01-04 MED ORDER — DEXAMETHASONE SODIUM PHOSPHATE 10 MG/ML IJ SOLN
INTRAMUSCULAR | Status: DC | PRN
Start: 2024-01-04 — End: 2024-01-04
  Administered 2024-01-04: 5 mg via INTRAVENOUS

## 2024-01-04 MED ORDER — ONDANSETRON HCL 4 MG/2ML IJ SOLN: 4.0000 mg | Freq: Four times a day (QID) | INTRAMUSCULAR | Status: DC | PRN

## 2024-01-04 MED ORDER — OXYCODONE HCL 5 MG/5ML PO SOLN: 5.0000 mg | Freq: Once | ORAL | Status: DC | PRN

## 2024-01-04 MED ORDER — ORAL CARE MOUTH RINSE: 15.0000 mL | Freq: Once | OROMUCOSAL | Status: AC

## 2024-01-04 MED ORDER — OXYCODONE HCL 5 MG PO TABS: 5.0000 mg | ORAL_TABLET | Freq: Once | ORAL | Status: DC | PRN

## 2024-01-04 MED ORDER — HYDROCODONE-ACETAMINOPHEN 5-325 MG PO TABS
1.0000 | ORAL_TABLET | Freq: Four times a day (QID) | ORAL | Status: DC | PRN
  Administered 2024-01-04: 1 via ORAL
  Filled 2024-01-04: qty 1

## 2024-01-04 MED ORDER — HYDROCODONE-ACETAMINOPHEN 5-325 MG PO TABS: 1.0000 | ORAL_TABLET | Freq: Four times a day (QID) | ORAL | 0 refills | Status: DC | PRN

## 2024-01-04 MED ORDER — ACETAMINOPHEN 10 MG/ML IV SOLN
1000.0000 mg | Freq: Once | INTRAVENOUS | Status: DC | PRN
  Administered 2024-01-04: 1000 mg via INTRAVENOUS

## 2024-01-04 MED ORDER — FENTANYL CITRATE (PF) 250 MCG/5ML IJ SOLN
INTRAMUSCULAR | Status: AC
  Filled 2024-01-04: qty 5

## 2024-01-04 MED ORDER — SUGAMMADEX SODIUM 200 MG/2ML IV SOLN
INTRAVENOUS | Status: DC | PRN
  Administered 2024-01-04: 200 mg via INTRAVENOUS

## 2024-01-04 MED ORDER — SIMETHICONE 80 MG PO CHEW: 80.0000 mg | CHEWABLE_TABLET | Freq: Four times a day (QID) | ORAL | Status: DC | PRN

## 2024-01-04 MED ORDER — PROCHLORPERAZINE EDISYLATE 10 MG/2ML IJ SOLN: 10.0000 mg | INTRAMUSCULAR | Status: DC | PRN

## 2024-01-04 MED ORDER — ACETAMINOPHEN 325 MG PO TABS: 325.0000 mg | ORAL_TABLET | Freq: Four times a day (QID) | ORAL | Status: AC | PRN

## 2024-01-04 SURGICAL SUPPLY — 40 items
APPLIER CLIP 5 13 M/L LIGAMAX5 (MISCELLANEOUS) IMPLANT
BAG COUNTER SPONGE SURGICOUNT (BAG) ×1 IMPLANT
BLADE CLIPPER SURG (BLADE) IMPLANT
CANISTER SUCT 3000ML PPV (MISCELLANEOUS) ×1 IMPLANT
CHLORAPREP W/TINT 26 (MISCELLANEOUS) ×1 IMPLANT
CLIP APPLIE 5 13 M/L LIGAMAX5 (MISCELLANEOUS) IMPLANT
COVER SURGICAL LIGHT HANDLE (MISCELLANEOUS) ×1 IMPLANT
CUTTER FLEX LINEAR 45M (STAPLE) IMPLANT
DERMABOND ADVANCED .7 DNX12 (GAUZE/BANDAGES/DRESSINGS) ×1 IMPLANT
ELECT REM PT RETURN 9FT ADLT (ELECTROSURGICAL) ×1 IMPLANT
ELECTRODE REM PT RTRN 9FT ADLT (ELECTROSURGICAL) ×1 IMPLANT
ENDOLOOP SUT PDS II 0 18 (SUTURE) IMPLANT
GLOVE BIOGEL PI IND STRL 6 (GLOVE) ×1 IMPLANT
GLOVE BIOGEL PI MICRO STRL 5.5 (GLOVE) ×1 IMPLANT
GOWN STRL REUS W/ TWL LRG LVL3 (GOWN DISPOSABLE) ×3 IMPLANT
IRRIG SUCT STRYKERFLOW 2 WTIP (MISCELLANEOUS) IMPLANT
IRRIGATION SUCT STRKRFLW 2 WTP (MISCELLANEOUS) IMPLANT
KIT BASIN OR (CUSTOM PROCEDURE TRAY) ×1 IMPLANT
KIT TURNOVER KIT B (KITS) ×1 IMPLANT
NS IRRIG 1000ML POUR BTL (IV SOLUTION) ×1 IMPLANT
PAD ARMBOARD 7.5X6 YLW CONV (MISCELLANEOUS) ×2 IMPLANT
PENCIL BUTTON HOLSTER BLD 10FT (ELECTRODE) ×1 IMPLANT
RELOAD STAPLE 45 3.5 BLU ETS (ENDOMECHANICALS) IMPLANT
RELOAD STAPLE TA45 3.5 REG BLU (ENDOMECHANICALS) ×2 IMPLANT
SCISSORS LAP 5X35 DISP (ENDOMECHANICALS) IMPLANT
SET TUBE SMOKE EVAC HIGH FLOW (TUBING) ×1 IMPLANT
SHEARS HARMONIC ACE PLUS 36CM (ENDOMECHANICALS) IMPLANT
SLEEVE Z-THREAD 5X100MM (TROCAR) ×1 IMPLANT
SPECIMEN JAR SMALL (MISCELLANEOUS) ×1 IMPLANT
SUT MNCRL AB 4-0 PS2 18 (SUTURE) ×1 IMPLANT
SYS BAG RETRIEVAL 10MM (BASKET) ×1 IMPLANT
SYSTEM BAG RETRIEVAL 10MM (BASKET) ×1 IMPLANT
TOWEL GREEN STERILE (TOWEL DISPOSABLE) ×1 IMPLANT
TOWEL GREEN STERILE FF (TOWEL DISPOSABLE) ×1 IMPLANT
TRAY FOLEY W/BAG SLVR 14FR (SET/KITS/TRAYS/PACK) IMPLANT
TRAY LAPAROSCOPIC MC (CUSTOM PROCEDURE TRAY) ×1 IMPLANT
TROCAR BALLN 12MMX100 BLUNT (TROCAR) ×1 IMPLANT
TROCAR Z-THREAD OPTICAL 5X100M (TROCAR) ×1 IMPLANT
WARMER LAPAROSCOPE (MISCELLANEOUS) ×1 IMPLANT
WATER STERILE IRR 1000ML POUR (IV SOLUTION) ×1 IMPLANT

## 2024-01-04 NOTE — Telephone Encounter (Signed)
 Copied from CRM [id number]. Topic: Appointments - Transfer of Care >> Jan 03, 2024  5:29 PM [name] wrote: Pt is requesting to transfer FROM: Virginia Moore, [name] Pt is requesting to transfer TO: Virginia Moore Reason for requested transfer: Unspecified It is the responsibility of the team the patient would like to transfer to (Dr. Verlon Moore) to reach out to the patient if for any reason this transfer is not acceptable.

## 2024-01-04 NOTE — H&P (Signed)
 Admitting Physician: Hyman Hopes Iram Astorino  Service: General surgery  CC: Abdominal pain  Subjective   HPI: Virginia Moore is an 88 y.o. female who is here for abdominal pain.  She has been having pain since Sunday evening (~48 hours) which was progressively worsening.  Has been having bowel function.  Not much of an appetitie.  No vomiting or fever.  Recent trip to [place]  Pain is located in the right lower quadrant.  Past Medical History:  Diagnosis Date   Hiatal hernia    Hypertension    Osteoporosis     Past Surgical History:  Procedure Laterality Date   ABDOMINAL HYSTERECTOMY      Family History  Problem Relation Age of Onset   Cancer Mother    Cancer Father     Social:  reports that she has never smoked. She has never used smokeless tobacco. She reports that she does not currently use alcohol. She reports that she does not use drugs.  Allergies:  Allergies  Allergen Reactions   Garlic Diarrhea and Nausea And Vomiting    Medications: Current Outpatient Medications  Medication Instructions   alendronate (FOSAMAX) 70 mg, Oral, Weekly   ascorbic acid (VITAMIN C) 500 mg, Oral, Daily   aspirin 81 mg, Oral, Daily   candesartan (ATACAND) 16 mg, Oral, Daily   Cholecalciferol (VITAMIN D3) 75 MCG (3000 UT) TABS Oral   Diclofenac Sodium 4 g, Transdermal, 4 times daily PRN   famotidine-calcium carbonate-magnesium hydroxide (PEPCID COMPLETE) 10-800-165 MG chewable tablet 1 tablet, Oral, Daily PRN   glucosamine-chondroitin 500-400 MG tablet 1 tablet, Oral, Daily   hydrochlorothiazide (HYDRODIURIL) 12.5 MG tablet TAKE 1 TABLET(12.5 MG) BY MOUTH DAILY   meloxicam (MOBIC) 15 mg, Oral, Daily   Multiple Vitamin (MULTIVITAMIN) capsule 1 capsule, Oral, Daily   rosuvastatin (CRESTOR) 20 mg, Oral, Daily at bedtime   tiZANidine (ZANAFLEX) 2 mg, Oral, Daily at bedtime    ROS - all of the below systems have been reviewed with the patient and positives are indicated with  bold text General: chills, fever or night sweats Eyes: blurry vision or double vision ENT: epistaxis or sore throat Allergy/Immunology: itchy/watery eyes or nasal congestion Hematologic/Lymphatic: bleeding problems, blood clots or swollen lymph nodes Endocrine: temperature intolerance or unexpected weight changes Breast: new or changing breast lumps or nipple discharge Resp: cough, shortness of breath, or wheezing CV: chest pain or dyspnea on exertion GI: as per HPI GU: dysuria, trouble voiding, or hematuria MSK: joint pain or joint stiffness Neuro: TIA or stroke symptoms Derm: pruritus and skin lesion changes Psych: anxiety and depression  Objective   PE Blood pressure (!) 157/78, pulse 65, temperature 98 F (36.7 C), temperature source Oral, resp. rate 16, SpO2 96%. Constitutional: NAD; conversant; no deformities Eyes: Moist conjunctiva; no lid lag; anicteric; PERRL Neck: Trachea midline; no thyromegaly Lungs: Normal respiratory effort; no tactile fremitus CV: RRR; no palpable thrills; no pitting edema GI: Abd RLQ tenderness; no palpable hepatosplenomegaly MSK: Normal range of motion of extremities; no clubbing/cyanosis Psychiatric: Appropriate affect; alert and oriented x3 Lymphatic: No palpable cervical or axillary lymphadenopathy  Results for orders placed or performed during the hospital encounter of 01/03/24 (from the past 24 hours)  Lipase, blood     Status: None   Collection Time: 01/03/24  1:43 PM  Result Value Ref Range   Lipase 36 11 - 51 U/L  Comprehensive metabolic panel     Status: Abnormal   Collection Time: 01/03/24  1:43 PM  Result Value Ref Range   Sodium 136 135 - 145 mmol/L   Potassium 3.6 3.5 - 5.1 mmol/L   Chloride 99 98 - 111 mmol/L   CO2 27 22 - 32 mmol/L   Glucose, Bld 98 70 - 99 mg/dL   BUN 13 8 - 23 mg/dL   Creatinine, Ser 1.61 0.44 - 1.00 mg/dL   Calcium 9.5 8.9 - 09.6 mg/dL   Total Protein 7.4 6.5 - 8.1 g/dL   Albumin 4.8 3.5 - 5.0 g/dL    AST 18 15 - 41 U/L   ALT 14 0 - 44 U/L   Alkaline Phosphatase 52 38 - 126 U/L   Total Bilirubin 2.9 (H) 0.0 - 1.2 mg/dL   GFR, Estimated >04 >54 mL/min   Anion gap 10 5 - 15  CBC     Status: None   Collection Time: 01/03/24  1:43 PM  Result Value Ref Range   WBC 9.4 4.0 - 10.5 K/uL   RBC 4.02 3.87 - 5.11 MIL/uL   Hemoglobin 12.6 12.0 - 15.0 g/dL   HCT 09.8 11.9 - 14.7 %   MCV 93.3 80.0 - 100.0 fL   MCH 31.3 26.0 - 34.0 pg   MCHC 33.6 30.0 - 36.0 g/dL   RDW 82.9 56.2 - 13.0 %   Platelets 197 150 - 400 K/uL   nRBC 0.0 0.0 - 0.2 %  Urinalysis, Routine w reflex microscopic -Urine, Clean Catch     Status: Abnormal   Collection Time: 01/03/24  5:00 PM  Result Value Ref Range   Color, Urine YELLOW YELLOW   APPearance CLEAR CLEAR   Specific Gravity, Urine 1.010 1.005 - 1.030   pH 6.0 5.0 - 8.0   Glucose, UA NEGATIVE NEGATIVE mg/dL   Hgb urine dipstick LARGE (A) NEGATIVE   Bilirubin Urine NEGATIVE NEGATIVE   Ketones, ur NEGATIVE NEGATIVE mg/dL   Protein, ur NEGATIVE NEGATIVE mg/dL   Nitrite NEGATIVE NEGATIVE   Leukocytes,Ua LARGE (A) NEGATIVE  Urinalysis, Microscopic (reflex)     Status: Abnormal   Collection Time: 01/03/24  5:00 PM  Result Value Ref Range   RBC / HPF 6-10 0 - 5 RBC/hpf   WBC, UA >50 0 - 5 WBC/hpf   Bacteria, UA NONE SEEN NONE SEEN   Squamous Epithelial / HPF 0-5 0 - 5 /HPF   Non Squamous Epithelial PRESENT (A) NONE SEEN   WBC Clumps PRESENT      Imaging Orders         CT ABDOMEN PELVIS W CONTRAST      Assessment and Plan   Virginia Moore is an 88 y.o. female with abdominal pain and CT findings consistent with appendicitis.  I recommended laparoscopic appendectomy.  We discussed the procedure, its risks, benefits and alteratives and the patient granted consent to proceed. I will discuss with Dr. Freida Busman who takes over the service in the morning and make plans for laparoscopic appendectomy.    ICD-10-CM   1. Acute appendicitis with localized  peritonitis, without perforation, abscess, or gangrene  K35.30        Quentin Ore, MD  [facility] Use [email or web address] to contact on call provider  New Patient Billing: [id number] - Moderate MDM

## 2024-01-04 NOTE — Discharge Summary (Signed)
 [facility] Discharge Summary   Patient ID: Virginia Moore MRN: [id number] DOB/AGE: 12/27/1935 88 y.o.  Admit date: 01/03/2024 Discharge date: 01/04/2024  Admitting Diagnosis: Acute appendicitis   Discharge Diagnosis S/P laparoscopic appendectomy   Consultants None   Imaging: CT ABDOMEN PELVIS W CONTRAST Result Date: 01/03/2024 CLINICAL DATA:  RLQ abdominal pain lower abdo, tender to touch guarding Bloating, burping Not relieved with beano pepto or gas x Seen at [facility] and sent for eval Recent travel from [place] EXAM: CT ABDOMEN AND PELVIS WITH CONTRAST TECHNIQUE: Multidetector CT imaging of the abdomen and pelvis was performed using the standard protocol following bolus administration of intravenous contrast. RADIATION DOSE REDUCTION: This exam was performed according to the departmental dose-optimization program which includes automated exposure control, adjustment of the mA and/or kV according to patient size and/or use of iterative reconstruction technique. CONTRAST:  OMNIPAQUE IOHEXOL 300 MG/ML  SOLN COMPARISON:  None Available. FINDINGS: Lower chest: No acute abnormality. Hepatobiliary: There is a 1 cm hypodensity of the left hepatic lobe and subcentimeter hypodensity too small to characterize. Otherwise no focal liver abnormality. Calcified gallstone noted within the gallbladder lumen. No gallbladder wall thickening or pericholecystic fluid. No biliary dilatation. Pancreas: No focal lesion. Normal pancreatic contour. No surrounding inflammatory changes. No main pancreatic ductal dilatation. Spleen: Normal in size without focal abnormality.  Splenule noted. Adrenals/Urinary Tract: No adrenal nodule bilaterally. Bilateral kidneys enhance symmetrically. Sub sent wise is not working subcentimeter hypodensities too small to characterize-no further follow-up. Extrarenal left pelvis. No hydronephrosis. No hydroureter.  No nephroureterolithiasis. The urinary bladder is unremarkable.  Stomach/Bowel: Stomach is within normal limits. No evidence of small bowel wall thickening or dilatation. Second portion of the duodenum diverticula. Colonic diverticulosis. The appendix is enlarged in caliber measuring up to 1.2 cm with associated fluid-filled lumen and appendiceal wall enhancement/thickening. Associated periappendiceal fat stranding. Question punctate appendicoliths within its lumen. No appendiceal wall discontinuity. Associated cecal wall bowel thickening. Vascular/Lymphatic: No abdominal aorta or iliac aneurysm. At least moderate atherosclerotic plaque of the aorta and its branches. No abdominal, pelvic, or inguinal lymphadenopathy. Reproductive: Status post hysterectomy. No adnexal masses. Other: No intraperitoneal free fluid. No intraperitoneal free gas. No organized fluid collection. Musculoskeletal: No abdominal wall hernia or abnormality. No suspicious lytic or blastic osseous lesions. No acute displaced fracture. Grade 1 anterolisthesis of L4 on L5. IMPRESSION: 1. Non-perforated acute appendicitis with question associated punctate appendicoliths. 2. Likely reactive changes of the adjacent cecum. 3. Colonic diverticulosis with no acute diverticulitis. 4.  Aortic Atherosclerosis (ICD10-I70.0). Electronically Signed   By: Tish Frederickson M.D.   On: 01/03/2024 19:44    Procedures Dr. Sophronia Simas (01/04/24) - Laparoscopic Appendectomy  Hospital Course:  Patient is an 88 year old female who presented to the ED with abdominal pain.  Workup showed acute appendicitis.  Patient was admitted and underwent procedure listed above.  Tolerated procedure well and was transferred to the floor.  Diet was advanced as tolerated.  On POD0, the patient was voiding well, tolerating diet, ambulating well, pain well controlled, vital signs stable, incisions c/d/i and felt stable for discharge home.  Patient will follow up in our office in 3 weeks and knows to call with questions or concerns.     I or a  member of my team have reviewed this patient in the Controlled Substance Database.   Allergies as of 01/04/2024       Reactions   Garlic Diarrhea, Nausea And Vomiting  Medication List     TAKE these medications    acetaminophen 325 MG tablet Commonly known as: TYLENOL Take 1 tablet (325 mg total) by mouth every 6 (six) hours as needed for mild pain (pain score 1-3).   alendronate 70 MG tablet Commonly known as: FOSAMAX Take 1 tablet (70 mg total) by mouth once a week for 52 doses.   ascorbic acid 500 MG tablet Commonly known as: VITAMIN C Take 1 tablet (500 mg total) by mouth daily.   aspirin 81 MG chewable tablet Chew 1 tablet (81 mg total) by mouth daily.   betamethasone valerate 0.1 % cream Commonly known as: VALISONE Apply topically daily.   candesartan 16 MG tablet Commonly known as: ATACAND Take 1 tablet (16 mg total) by mouth daily.   ciclopirox 8 % solution Commonly known as: PENLAC Apply topically daily.   Diclofenac Sodium 1 % Crea Place 4 g onto the skin 4 (four) times daily as needed (Apply a 4g (4.5-in) line to each knee.).   ezetimibe 10 MG tablet Commonly known as: ZETIA Take 10 mg by mouth daily.   glucosamine-chondroitin 500-400 MG tablet Take 1 tablet by mouth daily.   hydrochlorothiazide 12.5 MG tablet Commonly known as: HYDRODIURIL TAKE 1 TABLET(12.5 MG) BY MOUTH DAILY   HYDROcodone-acetaminophen 5-325 MG tablet Commonly known as: NORCO/VICODIN Take 1 tablet by mouth every 6 (six) hours as needed for up to 5 days for severe pain (pain score 7-10).   ketoconazole 2 % cream Commonly known as: NIZORAL Apply topically 2 (two) times daily.   meloxicam 15 MG tablet Commonly known as: MOBIC Take 1 tablet (15 mg total) by mouth daily.   multivitamin capsule Take 1 capsule by mouth daily.   Pepcid Complete 10-800-165 MG chewable tablet Generic drug: famotidine-calcium carbonate-magnesium hydroxide Chew 1 tablet by mouth daily  as needed.   rosuvastatin 20 MG tablet Commonly known as: Crestor Take 1 tablet (20 mg total) by mouth at bedtime.   tiZANidine 2 MG tablet Commonly known as: ZANAFLEX Take 1 tablet (2 mg total) by mouth at bedtime.   triamcinolone cream 0.1 % Commonly known as: KENALOG Apply topically 2 (two) times daily.   Vitamin D3 75 MCG (3000 UT) Tabs Take by mouth.   Zeasorb-AF 2 % powder Generic drug: miconazole Apply topically 2 (two) times daily.   zinc oxide 20 % ointment Apply topically 2 (two) times daily.          Follow-up Information     Maczis, Hedda Slade, [place]. Go on 02/02/2024.   Specialty: General Surgery Why: 1:30 PM, please arrive 30 min prior to appointment time to check in. Contact information: [address] [place] [address] [facility] [place] [address] [phone]                 Signed: Juliet Rude , [facility] 01/04/2024, 2:25 PM Please see Amion for pager number during day hours 7:00am-4:30pm

## 2024-01-04 NOTE — Anesthesia Postprocedure Evaluation (Signed)
 Anesthesia Post Note  Patient: Virginia Moore [facility]  Procedure(s) Performed: APPENDECTOMY, LAPAROSCOPIC (Abdomen)     Patient location during evaluation: PACU Anesthesia Type: General Level of consciousness: awake Pain management: pain level controlled Vital Signs Assessment: post-procedure vital signs reviewed and stable Respiratory status: spontaneous breathing, nonlabored ventilation and respiratory function stable Cardiovascular status: blood pressure returned to baseline and stable Postop Assessment: no apparent nausea or vomiting Anesthetic complications: yes   Encounter Notable Events  Notable Event Outcome Phase Comment  Difficult to intubate - expected  Intraprocedure Filed from anesthesia note documentation.    Last Vitals:  Vitals:   01/04/24 1115 01/04/24 1130  BP: (!) 169/77 (!) 160/84  Pulse: 79 86  Resp: 13 14  Temp:  36.4 C  SpO2: 93% 95%    Last Pain:  Vitals:   01/04/24 1114  TempSrc:   PainSc: 7                  Linton Rump

## 2024-01-04 NOTE — Anesthesia Procedure Notes (Signed)
 Procedure Name: Intubation Date/Time: 01/04/2024 10:14 AM  Performed by: Linton Rump, MDPre-anesthesia Checklist: Patient identified, Emergency Drugs available, Suction available and Patient being monitored Patient Re-evaluated:Patient Re-evaluated prior to induction Oxygen Delivery Method: Circle system utilized Preoxygenation: Pre-oxygenation with 100% oxygen Induction Type: IV induction Ventilation: Mask ventilation without difficulty Laryngoscope Size: Glidescope and 3 Grade View: Grade I Tube type: Oral Tube size: 7.0 mm Number of attempts: 1 Airway Equipment and Method: Oral airway, Rigid stylet and Video-laryngoscopy Placement Confirmation: ETT inserted through vocal cords under direct vision, positive ETCO2 and breath sounds checked- equal and bilateral Secured at: 21 cm Tube secured with: Tape Dental Injury: Teeth and Oropharynx as per pre-operative assessment  Difficulty Due To: Difficulty was anticipated and Difficult Airway- due to anterior larynx Comments: DL x1 with unsuccessful attempt using Mac 3 blade, Grade 3 view. Decision made to use glidescope.

## 2024-01-04 NOTE — Plan of Care (Signed)

## 2024-01-04 NOTE — Telephone Encounter (Signed)
 Left detailed message on machine for patient to call Guy Sandifer NP office for a visit.

## 2024-01-04 NOTE — Transfer of Care (Signed)
 Immediate Anesthesia Transfer of Care Note  Patient: Tacia Hindley [facility]  Procedure(s) Performed: APPENDECTOMY, LAPAROSCOPIC (Abdomen)  Patient Location: PACU  Anesthesia Type:General  Level of Consciousness: awake, alert , oriented, and patient cooperative  Airway & Oxygen Therapy: Patient Spontanous Breathing and Patient connected to nasal cannula oxygen  Post-op Assessment: Report given to RN and Post -op Vital signs reviewed and stable  Post vital signs: Reviewed and stable  Last Vitals:  Vitals Value Taken Time  BP 169/82 01/04/24 1053  Temp    Pulse 82 01/04/24 1056  Resp 18 01/04/24 1056  SpO2 89 % 01/04/24 1056  Vitals shown include unfiled device data.  Last Pain:  Vitals:   01/04/24 0910  TempSrc:   PainSc: 0-No pain         Complications:  Encounter Notable Events  Notable Event Outcome Phase Comment  Difficult to intubate - expected  Intraprocedure Filed from anesthesia note documentation.

## 2024-01-04 NOTE — Care Management Obs Status (Signed)
 MEDICARE OBSERVATION STATUS NOTIFICATION   Patient Details  Name: Virginia Moore MRN: [id number] Date of Birth: November 26, 1935   Medicare Observation Status Notification Given:  Yes    Kingsley Plan, RN 01/04/2024, 1:26 PM

## 2024-01-04 NOTE — Interval H&P Note (Signed)
 History and Physical Interval Note:  01/04/2024 9:41 AM  Virginia Moore  has presented today for surgery, with the diagnosis of appendicitis.  The various methods of treatment have been discussed with the patient and family. After consideration of risks, benefits and other options for treatment, the patient has consented to  Procedure(s): APPENDECTOMY, LAPAROSCOPIC (N/A) as a surgical intervention.  The patient's history has been reviewed, patient examined, no change in status, stable for surgery. I reviewed the risks and benefits of the procedure, including the risks of bleeding, infection, and appendiceal stump leak. I have reviewed the patient's chart and labs.  Questions were answered to the patient's satisfaction. Anticipate discharge home this afternoon.   Fritzi Mandes

## 2024-01-04 NOTE — H&P (Incomplete)
 Admitting Physician: Hyman Hopes Maccoy Haubner  Service: General surgery  CC: Abdominal pain  Subjective   HPI: Virginia Moore is an 88 y.o. female who is here for abdominal pain.  She has been having pain since Sunday evening (~48 hours) which was progressively worsening.  Has been having bowel function.  Not much of an appetitie.  No vomiting or fever.  Recent trip to [place]  Pain is located in the right lower quadrant.  Past Medical History:  Diagnosis Date  . Hiatal hernia   . Hypertension   . Osteoporosis     Past Surgical History:  Procedure Laterality Date  . ABDOMINAL HYSTERECTOMY      Family History  Problem Relation Age of Onset  . Cancer Mother   . Cancer Father     Social:  reports that she has never smoked. She has never used smokeless tobacco. She reports that she does not currently use alcohol. She reports that she does not use drugs.  Allergies:  Allergies  Allergen Reactions  . Garlic Diarrhea and Nausea And Vomiting    Medications: Current Outpatient Medications  Medication Instructions  . alendronate (FOSAMAX) 70 mg, Oral, Weekly  . ascorbic acid (VITAMIN C) 500 mg, Oral, Daily  . aspirin 81 mg, Oral, Daily  . candesartan (ATACAND) 16 mg, Oral, Daily  . Cholecalciferol (VITAMIN D3) 75 MCG (3000 UT) TABS Oral  . Diclofenac Sodium 4 g, Transdermal, 4 times daily PRN  . famotidine-calcium carbonate-magnesium hydroxide (PEPCID COMPLETE) 10-800-165 MG chewable tablet 1 tablet, Oral, Daily PRN  . glucosamine-chondroitin 500-400 MG tablet 1 tablet, Oral, Daily  . hydrochlorothiazide (HYDRODIURIL) 12.5 MG tablet TAKE 1 TABLET(12.5 MG) BY MOUTH DAILY  . meloxicam (MOBIC) 15 mg, Oral, Daily  . Multiple Vitamin (MULTIVITAMIN) capsule 1 capsule, Oral, Daily  . rosuvastatin (CRESTOR) 20 mg, Oral, Daily at bedtime  . tiZANidine (ZANAFLEX) 2 mg, Oral, Daily at bedtime    ROS - all of the below systems have been reviewed with the patient and positives  are indicated with bold text General: chills, fever or night sweats Eyes: blurry vision or double vision ENT: epistaxis or sore throat Allergy/Immunology: itchy/watery eyes or nasal congestion Hematologic/Lymphatic: bleeding problems, blood clots or swollen lymph nodes Endocrine: temperature intolerance or unexpected weight changes Breast: new or changing breast lumps or nipple discharge Resp: cough, shortness of breath, or wheezing CV: chest pain or dyspnea on exertion GI: as per HPI GU: dysuria, trouble voiding, or hematuria MSK: joint pain or joint stiffness Neuro: TIA or stroke symptoms Derm: pruritus and skin lesion changes Psych: anxiety and depression  Objective   PE Blood pressure (!) 157/78, pulse 65, temperature 98 F (36.7 C), temperature source Oral, resp. rate 16, SpO2 96%. Constitutional: NAD; conversant; no deformities Eyes: Moist conjunctiva; no lid lag; anicteric; PERRL Neck: Trachea midline; no thyromegaly Lungs: Normal respiratory effort; no tactile fremitus CV: RRR; no palpable thrills; no pitting edema GI: Abd RLQ tenderness; no palpable hepatosplenomegaly MSK: Normal range of motion of extremities; no clubbing/cyanosis Psychiatric: Appropriate affect; alert and oriented x3 Lymphatic: No palpable cervical or axillary lymphadenopathy  Results for orders placed or performed during the hospital encounter of 01/03/24 (from the past 24 hours)  Lipase, blood     Status: None   Collection Time: 01/03/24  1:43 PM  Result Value Ref Range   Lipase 36 11 - 51 U/L  Comprehensive metabolic panel     Status: Abnormal   Collection Time: 01/03/24  1:43 PM  Result Value Ref Range   Sodium 136 135 - 145 mmol/L   Potassium 3.6 3.5 - 5.1 mmol/L   Chloride 99 98 - 111 mmol/L   CO2 27 22 - 32 mmol/L   Glucose, Bld 98 70 - 99 mg/dL   BUN 13 8 - 23 mg/dL   Creatinine, Ser 9.62 0.44 - 1.00 mg/dL   Calcium 9.5 8.9 - 95.2 mg/dL   Total Protein 7.4 6.5 - 8.1 g/dL   Albumin  4.8 3.5 - 5.0 g/dL   AST 18 15 - 41 U/L   ALT 14 0 - 44 U/L   Alkaline Phosphatase 52 38 - 126 U/L   Total Bilirubin 2.9 (H) 0.0 - 1.2 mg/dL   GFR, Estimated >84 >13 mL/min   Anion gap 10 5 - 15  CBC     Status: None   Collection Time: 01/03/24  1:43 PM  Result Value Ref Range   WBC 9.4 4.0 - 10.5 K/uL   RBC 4.02 3.87 - 5.11 MIL/uL   Hemoglobin 12.6 12.0 - 15.0 g/dL   HCT 24.4 01.0 - 27.2 %   MCV 93.3 80.0 - 100.0 fL   MCH 31.3 26.0 - 34.0 pg   MCHC 33.6 30.0 - 36.0 g/dL   RDW 53.6 64.4 - 03.4 %   Platelets 197 150 - 400 K/uL   nRBC 0.0 0.0 - 0.2 %  Urinalysis, Routine w reflex microscopic -Urine, Clean Catch     Status: Abnormal   Collection Time: 01/03/24  5:00 PM  Result Value Ref Range   Color, Urine YELLOW YELLOW   APPearance CLEAR CLEAR   Specific Gravity, Urine 1.010 1.005 - 1.030   pH 6.0 5.0 - 8.0   Glucose, UA NEGATIVE NEGATIVE mg/dL   Hgb urine dipstick LARGE (A) NEGATIVE   Bilirubin Urine NEGATIVE NEGATIVE   Ketones, ur NEGATIVE NEGATIVE mg/dL   Protein, ur NEGATIVE NEGATIVE mg/dL   Nitrite NEGATIVE NEGATIVE   Leukocytes,Ua LARGE (A) NEGATIVE  Urinalysis, Microscopic (reflex)     Status: Abnormal   Collection Time: 01/03/24  5:00 PM  Result Value Ref Range   RBC / HPF 6-10 0 - 5 RBC/hpf   WBC, UA >50 0 - 5 WBC/hpf   Bacteria, UA NONE SEEN NONE SEEN   Squamous Epithelial / HPF 0-5 0 - 5 /HPF   Non Squamous Epithelial PRESENT (A) NONE SEEN   WBC Clumps PRESENT      Imaging Orders         CT ABDOMEN PELVIS W CONTRAST      Assessment and Plan   [name] is an 88 y.o. female with abdominal pain and CT findings consistent with appendicitis.  I recommended laparoscopic appendectomy.  We discussed the procedure, its risks, benefits and alteratives and the patient granted consent to proceed. I will discuss with Dr. Freida Busman who takes over the service in the morning and make plans for laparoscopic appendectomy.    ICD-10-CM   1. Acute appendicitis with  localized peritonitis, without perforation, abscess, or gangrene  K35.30        Quentin Ore, MD  [facility] Use [email or web address] to contact on call provider  {[email or web address]}

## 2024-01-04 NOTE — Op Note (Signed)
 Date: 01/04/24  Patient: Virginia Moore MRN: [id number]  Preoperative Diagnosis: Acute appendicitis Postoperative Diagnosis: Same  Procedure: Laparoscopic appendectomy  Surgeon: Sophronia Simas, MD  EBL: Minimal  Anesthesia: General endotracheal  Specimens: Appendix  Indications: Virginia Moore is an 88 yo female who presented to the ED with two days of acute onset abdominal pain. A CT scan was consistent with early appendicitis without perforation or abscess. After a discussion of the risks and benefits of surgery, she agreed to proceed with appendectomy.  Findings: Acute non-perforated appendicitis. Small cysts in the left lobe of the liver, corresponding with preop imaging.  Procedure details: Informed consent was obtained in the preoperative area prior to the procedure. The patient was brought to the operating room and placed on the table in the supine position. General anesthesia was induced and appropriate lines and drains were placed for intraoperative monitoring. Perioperative antibiotics were administered per SCIP guidelines. The abdomen was prepped and draped in the usual sterile fashion. A pre-procedure timeout was taken verifying patient identity, surgical site and procedure to be performed.  A small infraumbilical skin incision was made and the subcutaneous tissue was divided with cautery to expose the fascia. The umbilical stalk was grasped and elevated, and the fascia was sharply incised. The peritoneal cavity was visualized and a 12mm Hasson trocar was inserted. The abdomen was insufflated and inspected with no evidence of visceral or vascular injury. A suprapubic 5mm port was placed, followed by a 5mm port in the LLQ, both under direct visualization. The appendix was identified in the RLQ, and was dilated and erythematous. There were some retroperitoneal attachments, which were divided with harmonic shears. A mesenteric window was bluntly created at the base of the appendix.  The mesoappendix was divided with the harmonic. The appendix was divided at the base from the cecum using a 45mm stapler with a blue load. Two staple loads were used, and a small button of cecum was taken with the appendix. The specimen was placed in an endocatch bag. The surgical site was irrigated. The staple lines were inspected and appeared in tact with no bleeding or leakage. The ports were removed and the pneumoperitoneum was evacuated. The specimen was extracted via the umbilical port site and sent for routine pathology. The umbilical port site fascia was closed with a 0 Vicryl pursestring suture. The skin at all port sites was closed with 4-0 monocryl subcuticular suture. Dermabond was applied.  The patient tolerated the procedure well with no apparent complications. All counts were correct x2 at the end of the procedure. The patient was extubated and taken to PACU in stable condition.  Sophronia Simas, MD 01/04/24 10:46 AM

## 2024-01-04 NOTE — Discharge Instructions (Signed)

## 2024-01-04 NOTE — Plan of Care (Signed)
 Patient ID: Lavetta Geier [facility], female   DOB: 1935/12/03, 88 y.o.   MRN: [id number]  Problem: Education: Goal: Knowledge of General Education information will improve Description: Including pain rating scale, medication(s)/side effects and non-pharmacologic comfort measures Outcome: Progressing   Problem: Health Behavior/Discharge Planning: Goal: Ability to manage health-related needs will improve Outcome: Progressing   Problem: Clinical Measurements: Goal: Ability to maintain clinical measurements within normal limits will improve Outcome: Progressing Goal: Will remain free from infection Outcome: Progressing Goal: Diagnostic test results will improve Outcome: Progressing Goal: Respiratory complications will improve Outcome: Progressing Goal: Cardiovascular complication will be avoided Outcome: Progressing   Problem: Activity: Goal: Risk for activity intolerance will decrease Outcome: Progressing   Problem: Nutrition: Goal: Adequate nutrition will be maintained Outcome: Progressing   Problem: Coping: Goal: Level of anxiety will decrease Outcome: Progressing   Problem: Elimination: Goal: Will not experience complications related to bowel motility Outcome: Progressing Goal: Will not experience complications related to urinary retention Outcome: Progressing   Problem: Pain Managment: Goal: General experience of comfort will improve and/or be controlled Outcome: Progressing   Problem: Safety: Goal: Ability to remain free from injury will improve Outcome: Progressing   Problem: Skin Integrity: Goal: Risk for impaired skin integrity will decrease Outcome: Progressing    Lidia Collum, RN

## 2024-01-04 NOTE — Care Management CC44 (Signed)
 Condition Code 44 Documentation Completed  Patient Details  Name: Virginia Moore MRN: [id number] Date of Birth: 01-04-1936   Condition Code 44 given:  Yes Patient signature on Condition Code 44 notice:  Yes Documentation of 2 MD's agreement:  Yes Code 44 added to claim:  Yes    Kingsley Plan, RN 01/04/2024, 1:26 PM

## 2024-01-05 ENCOUNTER — Encounter (HOSPITAL_COMMUNITY): Payer: Self-pay | Admitting: Surgery

## 2024-01-06 LAB — SURGICAL PATHOLOGY

## 2024-01-10 ENCOUNTER — Encounter: Payer: Self-pay | Admitting: Urgent Care

## 2024-01-10 ENCOUNTER — Ambulatory Visit (INDEPENDENT_AMBULATORY_CARE_PROVIDER_SITE_OTHER): Admitting: Urgent Care

## 2024-01-10 VITALS — BP 160/90 | HR 61 | Wt 144.0 lb

## 2024-01-10 DIAGNOSIS — Z9049 Acquired absence of other specified parts of digestive tract: Secondary | ICD-10-CM

## 2024-01-10 DIAGNOSIS — K5909 Other constipation: Secondary | ICD-10-CM | POA: Diagnosis not present

## 2024-01-10 DIAGNOSIS — I10 Essential (primary) hypertension: Secondary | ICD-10-CM | POA: Diagnosis not present

## 2024-01-10 DIAGNOSIS — E7800 Pure hypercholesterolemia, unspecified: Secondary | ICD-10-CM | POA: Diagnosis not present

## 2024-01-10 DIAGNOSIS — R17 Unspecified jaundice: Secondary | ICD-10-CM

## 2024-01-10 NOTE — Patient Instructions (Addendum)
 Please monitor your blood pressure at home - goal BP should be closer to 120/80. Bring BP cuff and log to follow up visit. Please monitor your salt intake - try to not exceed 3 g sodium daily.  Continue taking your candesartan as previously ordered.  Increase your miralax to one capful twice daily. Start eating bran flakes or grapenuts at breakfast - these will help with bowel movements.  Continue 100mg  colace twice daily.  Please schedule a follow up here in 2-3 weeks.

## 2024-01-11 ENCOUNTER — Encounter: Payer: Self-pay | Admitting: Urgent Care

## 2024-01-11 NOTE — Progress Notes (Signed)
 New Patient Office Visit  Subjective:  Patient ID: Virginia Moore, female    DOB: [birth year]  Age: 88 y.o. MRN: [id number]  CC:  Chief Complaint  Patient presents with   Establish Care    New pt transfer care. Pt had an emergency appendectomy last week.    HPI Virginia Moore [place] presents to establish care.  Pleasant 88 year old [place] female presents to establish care after recent appendectomy. She is accompanied by her daughter.  She recently underwent an appendectomy after developing severe abdominal pain while traveling back from [place] Initially attributing the pain to food consumption, it persisted during her flight and worsened upon arrival at her son's house, accompanied by chills and shakiness. She sought care at an urgent care facility where appendicitis was suspected, leading to a referral for a CT scan that confirmed the diagnosis. Surgery was performed on March 11th, with an overnight hospitalization and discharge the following day. Post-surgery, she denies significant pain at the incision sites but is dissatisfied with the appearance of the incision at her belly button. She has not been using any wound care prescriptions recently. The dermabond is still in place, surgical follow up scheduled for 02/02/24.  She reports significant constipation post-surgery (but notes her normal schedule prior was only 2-3x/ week), describing her bowel movements as infrequent and hard. Her last bowel movement was achieved with a suppository, and she has been using Miralax and Colace, along with dietary measures like prunes and prune juice, to alleviate symptoms. Despite these efforts, she remains constipated and has not returned to her normal bowel movement schedule, which was irregular even before surgery. She also feels bloated and experiences frequent burping.  She is not currently taking baby aspirin but continues on candesartan 4 mg for blood pressure, prescribed by her  cardiologist in [place] She also takes rosuvastatin for cholesterol and uses tizanidine for musculoskeletal pain management. She occasionally uses meloxicam and takes a multivitamin and vitamin C supplements. She has discontinued several medications, including a topical steroid cream, diclofenac cream, and Zetia.  Her blood pressure was previously managed with a higher dose of candesartan (16mg ), but was reduced due to symptoms of lightheadedness. She also was on hydrochlorothiazide in the remote past but states not having been on it for "some time now." Blood pressure was recorded at 160/90 mmHg in the office, which may be due to anxiety (pt reports feeling anxious). She monitors her blood pressure at home occasionally.  She has a history of essential tremor in her left hand, which does not interfere with her daily activities such as crocheting or eating. She has been evaluated by a neurologist who ruled out Parkinson's disease.  Born and raised in [place] she enjoys gardening and crocheting.       Outpatient Encounter Medications as of 01/10/2024  Medication Sig   acetaminophen (TYLENOL) 325 MG tablet Take 1 tablet (325 mg total) by mouth every 6 (six) hours as needed for mild pain (pain score 1-3).   ZEASORB-AF 2 % powder Apply topically 2 (two) times daily.   zinc oxide 20 % ointment Apply topically 2 (two) times daily.   [DISCONTINUED] betamethasone valerate (VALISONE) 0.1 % cream Apply topically daily.   [DISCONTINUED] Cholecalciferol (VITAMIN D3) 75 MCG (3000 UT) TABS Take by mouth.   [DISCONTINUED] ciclopirox (PENLAC) 8 % solution Apply topically daily.   [DISCONTINUED] ezetimibe (ZETIA) 10 MG tablet Take 10 mg by mouth daily.   [DISCONTINUED] hydrochlorothiazide (HYDRODIURIL)  12.5 MG tablet TAKE 1 TABLET(12.5 MG) BY MOUTH DAILY   [DISCONTINUED] ketoconazole (NIZORAL) 2 % cream Apply topically 2 (two) times daily.   [DISCONTINUED] triamcinolone cream (KENALOG) 0.1 % Apply  topically 2 (two) times daily.   alendronate (FOSAMAX) 70 MG tablet Take 1 tablet (70 mg total) by mouth once a week for 52 doses.   candesartan (ATACAND) 16 MG tablet Take 1 tablet (16 mg total) by mouth daily.   Diclofenac Sodium 1 % CREA Place 4 g onto the skin 4 (four) times daily as needed (Apply a 4g (4.5-in) line to each knee.).   famotidine-calcium carbonate-magnesium hydroxide (PEPCID COMPLETE) 10-800-165 MG chewable tablet Chew 1 tablet by mouth daily as needed.   glucosamine-chondroitin 500-400 MG tablet Take 1 tablet by mouth daily.   meloxicam (MOBIC) 15 MG tablet Take 1 tablet (15 mg total) by mouth daily.   Multiple Vitamin (MULTIVITAMIN) capsule Take 1 capsule by mouth daily.   rosuvastatin (CRESTOR) 20 MG tablet Take 1 tablet (20 mg total) by mouth at bedtime.   tiZANidine (ZANAFLEX) 2 MG tablet Take 1 tablet (2 mg total) by mouth at bedtime.   vitamin C (ASCORBIC ACID) 500 MG tablet Take 1 tablet (500 mg total) by mouth daily.   [DISCONTINUED] aspirin 81 MG chewable tablet Chew 1 tablet (81 mg total) by mouth daily.   No facility-administered encounter medications on file as of 01/10/2024.    Past Medical History:  Diagnosis Date   Colon polyps    Hiatal hernia    Hypertension    Osteoporosis     Past Surgical History:  Procedure Laterality Date   ABDOMINAL HYSTERECTOMY  2014   LAPAROSCOPIC APPENDECTOMY N/A 01/04/2024   Procedure: APPENDECTOMY, LAPAROSCOPIC;  Surgeon: Fritzi Mandes, MD;  Location: [facility] OR;  Service: General;  Laterality: N/A;    Family History  Problem Relation Age of Onset   Cancer Mother    Cancer Father     Social History   Socioeconomic History   Marital status: Married    Spouse name: Not on file   Number of children: Not on file   Years of education: Not on file   Highest education level: Not on file  Occupational History   Not on file  Tobacco Use   Smoking status: Never   Smokeless tobacco: Never  Substance and Sexual  Activity   Alcohol use: Yes   Drug use: Never   Sexual activity: Not Currently    Partners: Male  Other Topics Concern   Not on file  Social History Narrative   Not on file   Social Drivers of Health   Financial Resource Strain: Not on file  Food Insecurity: Not on file  Transportation Needs: Not on file  Physical Activity: Not on file  Stress: Not on file  Social Connections: Unknown (04/15/2022)   Received from [facility]   Social Network    Social Network: Not on file  Intimate Partner Violence: Unknown (04/15/2022)   Received from [facility]   HITS    Physically Hurt: Not on file    Insult or Talk Down To: Not on file    Threaten Physical Harm: Not on file    Scream or Curse: Not on file    ROS: as noted in HPI  Objective:  BP (!) 160/90   Pulse 61   Wt 144 lb (65.3 kg)   SpO2 97%   BMI (P) 26.34 kg/m   Physical Exam Vitals and nursing note  reviewed. Exam conducted with a chaperone present.  Constitutional:      General: She is not in acute distress.    Appearance: Normal appearance. She is not ill-appearing, toxic-appearing or diaphoretic.  HENT:     Head: Normocephalic and atraumatic.     Right Ear: External ear normal. There is impacted cerumen.     Left Ear: External ear normal. There is impacted cerumen.     Nose: Nose normal.     Mouth/Throat:     Mouth: Mucous membranes are moist.     Pharynx: Oropharynx is clear. No oropharyngeal exudate or posterior oropharyngeal erythema.  Eyes:     General: No scleral icterus.       Right eye: No discharge.        Left eye: No discharge.     Extraocular Movements: Extraocular movements intact.     Pupils: Pupils are equal, round, and reactive to light.  Cardiovascular:     Rate and Rhythm: Normal rate and regular rhythm.  Pulmonary:     Effort: Pulmonary effort is normal. No respiratory distress.     Breath sounds: Normal breath sounds. No stridor. No wheezing or rhonchi.  Abdominal:     General:  Abdomen is flat. A surgical scar is present. There is no distension.     Palpations: Abdomen is soft.     Tenderness: There is no abdominal tenderness.    Musculoskeletal:     Cervical back: Normal range of motion. No rigidity.  Lymphadenopathy:     Cervical: No cervical adenopathy.  Neurological:     Mental Status: She is alert.     Assessment & Plan:  Essential hypertension  Pure hypercholesterolemia  Other constipation  High bilirubin  Status post laparoscopic appendectomy  Assessment and Plan    Postoperative Constipation Constipation persists post-appendectomy despite Miralax and Colace use. - Increase Miralax to one capful in the morning and one at night if needed. - Encourage intake of high-fiber foods such as grape nuts or bran flakes. - Ensure adequate hydration. - continue colace 100mg  BID  Hypertension Blood pressure elevated at 160/90, possibly anxiety-related. Reduced candesartan dose due to side effects per her cardiologist in [place]. - Monitor blood pressure at home and record readings. - Reassess blood pressure control in 2-3 weeks. - Consider adjusting medication if home readings remain elevated.  Essential Tremor Tremor in left hand, not interfering with daily activities. Evaluated by neurologist, not Parkinson's disease. - Consider beta blocker if blood pressure remains elevated and tremor becomes problematic.  Osteoarthritis Osteoarthritis without significant symptoms. - Continue meloxicam as needed for pain management.  Hyperlipidemia On rosuvastatin for cholesterol management. - Continue rosuvastatin for cholesterol management.        Return in about 3 weeks (around 01/31/2024).   Maretta Bees, PA

## 2024-01-30 ENCOUNTER — Ambulatory Visit: Admitting: Urgent Care

## 2024-03-06 ENCOUNTER — Ambulatory Visit: Admitting: Urgent Care
